# Patient Record
Sex: Female | Born: 1945 | Race: Black or African American | Hispanic: No | Marital: Married | Smoking: Former smoker
Health system: Southern US, Community
[De-identification: ages and names within clinical notes are randomized; demographics above are authoritative.]

## PROBLEM LIST (undated history)

## (undated) DIAGNOSIS — E785 Hyperlipidemia, unspecified: Secondary | ICD-10-CM

## (undated) DIAGNOSIS — I714 Abdominal aortic aneurysm, without rupture, unspecified: Secondary | ICD-10-CM

## (undated) DIAGNOSIS — I10 Essential (primary) hypertension: Secondary | ICD-10-CM

## (undated) HISTORY — PX: BREAST BIOPSY: SHX20

## (undated) HISTORY — DX: Abdominal aortic aneurysm, without rupture, unspecified: I71.40

## (undated) HISTORY — DX: Hyperlipidemia, unspecified: E78.5

---

## 1971-02-20 HISTORY — PX: CEREBRAL ANEURYSM REPAIR: SHX164

## 2016-04-18 DIAGNOSIS — Z1231 Encounter for screening mammogram for malignant neoplasm of breast: Secondary | ICD-10-CM | POA: Diagnosis not present

## 2016-04-18 DIAGNOSIS — I10 Essential (primary) hypertension: Secondary | ICD-10-CM | POA: Diagnosis not present

## 2016-04-24 ENCOUNTER — Other Ambulatory Visit: Payer: Self-pay | Admitting: Family

## 2016-04-24 DIAGNOSIS — Z1231 Encounter for screening mammogram for malignant neoplasm of breast: Secondary | ICD-10-CM

## 2016-05-08 ENCOUNTER — Ambulatory Visit
Admission: RE | Admit: 2016-05-08 | Discharge: 2016-05-08 | Disposition: A | Discharge status: Home / Self Care | Payer: Medicare HMO | Source: Ambulatory Visit | Attending: Family | Admitting: Family

## 2016-05-08 DIAGNOSIS — Z1231 Encounter for screening mammogram for malignant neoplasm of breast: Secondary | ICD-10-CM | POA: Diagnosis not present

## 2016-07-23 DIAGNOSIS — S80861A Insect bite (nonvenomous), right lower leg, initial encounter: Secondary | ICD-10-CM | POA: Diagnosis not present

## 2016-07-23 DIAGNOSIS — R21 Rash and other nonspecific skin eruption: Secondary | ICD-10-CM | POA: Diagnosis not present

## 2016-07-23 DIAGNOSIS — W57XXXA Bitten or stung by nonvenomous insect and other nonvenomous arthropods, initial encounter: Secondary | ICD-10-CM | POA: Diagnosis not present

## 2016-08-14 DIAGNOSIS — R69 Illness, unspecified: Secondary | ICD-10-CM | POA: Diagnosis not present

## 2016-10-17 DIAGNOSIS — I10 Essential (primary) hypertension: Secondary | ICD-10-CM | POA: Diagnosis not present

## 2016-10-17 DIAGNOSIS — Z720 Tobacco use: Secondary | ICD-10-CM | POA: Diagnosis not present

## 2016-10-17 DIAGNOSIS — D649 Anemia, unspecified: Secondary | ICD-10-CM | POA: Diagnosis not present

## 2016-10-17 DIAGNOSIS — M810 Age-related osteoporosis without current pathological fracture: Secondary | ICD-10-CM | POA: Diagnosis not present

## 2016-10-17 DIAGNOSIS — E782 Mixed hyperlipidemia: Secondary | ICD-10-CM | POA: Diagnosis not present

## 2017-12-16 ENCOUNTER — Other Ambulatory Visit: Payer: Self-pay | Admitting: Nurse Practitioner

## 2017-12-16 DIAGNOSIS — Z0000 Encounter for general adult medical examination without abnormal findings: Secondary | ICD-10-CM | POA: Diagnosis not present

## 2017-12-16 DIAGNOSIS — D649 Anemia, unspecified: Secondary | ICD-10-CM | POA: Diagnosis not present

## 2017-12-16 DIAGNOSIS — Z720 Tobacco use: Secondary | ICD-10-CM | POA: Diagnosis not present

## 2017-12-16 DIAGNOSIS — Z1239 Encounter for other screening for malignant neoplasm of breast: Secondary | ICD-10-CM | POA: Diagnosis not present

## 2017-12-16 DIAGNOSIS — M810 Age-related osteoporosis without current pathological fracture: Secondary | ICD-10-CM | POA: Diagnosis not present

## 2017-12-16 DIAGNOSIS — R69 Illness, unspecified: Secondary | ICD-10-CM | POA: Diagnosis not present

## 2017-12-16 DIAGNOSIS — I10 Essential (primary) hypertension: Secondary | ICD-10-CM | POA: Diagnosis not present

## 2017-12-16 DIAGNOSIS — Z1211 Encounter for screening for malignant neoplasm of colon: Secondary | ICD-10-CM | POA: Diagnosis not present

## 2017-12-16 DIAGNOSIS — Z1231 Encounter for screening mammogram for malignant neoplasm of breast: Secondary | ICD-10-CM

## 2017-12-16 DIAGNOSIS — E782 Mixed hyperlipidemia: Secondary | ICD-10-CM | POA: Diagnosis not present

## 2017-12-18 ENCOUNTER — Other Ambulatory Visit: Payer: Self-pay | Admitting: Nurse Practitioner

## 2017-12-18 DIAGNOSIS — M810 Age-related osteoporosis without current pathological fracture: Secondary | ICD-10-CM

## 2018-01-03 ENCOUNTER — Other Ambulatory Visit: Payer: Self-pay | Admitting: Nurse Practitioner

## 2018-01-03 DIAGNOSIS — M8580 Other specified disorders of bone density and structure, unspecified site: Secondary | ICD-10-CM

## 2018-01-14 ENCOUNTER — Ambulatory Visit
Admission: RE | Admit: 2018-01-14 | Discharge: 2018-01-14 | Disposition: A | Discharge status: Home / Self Care | Payer: Medicare HMO | Source: Ambulatory Visit | Attending: Nurse Practitioner | Admitting: Nurse Practitioner

## 2018-01-14 DIAGNOSIS — Z780 Asymptomatic menopausal state: Secondary | ICD-10-CM | POA: Diagnosis not present

## 2018-01-14 DIAGNOSIS — Z1231 Encounter for screening mammogram for malignant neoplasm of breast: Secondary | ICD-10-CM

## 2018-01-14 DIAGNOSIS — M8580 Other specified disorders of bone density and structure, unspecified site: Secondary | ICD-10-CM

## 2018-01-14 DIAGNOSIS — M8589 Other specified disorders of bone density and structure, multiple sites: Secondary | ICD-10-CM | POA: Diagnosis not present

## 2018-07-16 DIAGNOSIS — Z720 Tobacco use: Secondary | ICD-10-CM | POA: Diagnosis not present

## 2018-07-16 DIAGNOSIS — I10 Essential (primary) hypertension: Secondary | ICD-10-CM | POA: Diagnosis not present

## 2018-07-16 DIAGNOSIS — M810 Age-related osteoporosis without current pathological fracture: Secondary | ICD-10-CM | POA: Diagnosis not present

## 2018-07-16 DIAGNOSIS — E785 Hyperlipidemia, unspecified: Secondary | ICD-10-CM | POA: Diagnosis not present

## 2019-01-07 ENCOUNTER — Other Ambulatory Visit: Payer: Self-pay | Admitting: Internal Medicine

## 2019-01-07 DIAGNOSIS — R3989 Other symptoms and signs involving the genitourinary system: Secondary | ICD-10-CM | POA: Diagnosis not present

## 2019-01-07 DIAGNOSIS — Z1231 Encounter for screening mammogram for malignant neoplasm of breast: Secondary | ICD-10-CM | POA: Diagnosis not present

## 2019-01-07 DIAGNOSIS — I10 Essential (primary) hypertension: Secondary | ICD-10-CM | POA: Diagnosis not present

## 2019-01-07 DIAGNOSIS — E785 Hyperlipidemia, unspecified: Secondary | ICD-10-CM | POA: Diagnosis not present

## 2019-01-20 ENCOUNTER — Other Ambulatory Visit: Payer: Self-pay

## 2019-01-20 ENCOUNTER — Ambulatory Visit
Admission: RE | Admit: 2019-01-20 | Discharge: 2019-01-20 | Disposition: A | Discharge status: Home / Self Care | Payer: Medicare HMO | Source: Ambulatory Visit | Attending: Internal Medicine | Admitting: Internal Medicine

## 2019-01-20 DIAGNOSIS — Z1231 Encounter for screening mammogram for malignant neoplasm of breast: Secondary | ICD-10-CM

## 2019-01-21 ENCOUNTER — Other Ambulatory Visit: Payer: Self-pay | Admitting: Internal Medicine

## 2019-01-21 DIAGNOSIS — R928 Other abnormal and inconclusive findings on diagnostic imaging of breast: Secondary | ICD-10-CM

## 2019-01-27 ENCOUNTER — Ambulatory Visit
Admission: RE | Admit: 2019-01-27 | Discharge: 2019-01-27 | Disposition: A | Discharge status: Home / Self Care | Payer: Medicare HMO | Source: Ambulatory Visit | Attending: Internal Medicine | Admitting: Internal Medicine

## 2019-01-27 ENCOUNTER — Other Ambulatory Visit: Payer: Self-pay | Admitting: Internal Medicine

## 2019-01-27 ENCOUNTER — Other Ambulatory Visit: Payer: Self-pay

## 2019-01-27 DIAGNOSIS — R928 Other abnormal and inconclusive findings on diagnostic imaging of breast: Secondary | ICD-10-CM

## 2019-01-27 DIAGNOSIS — N6320 Unspecified lump in the left breast, unspecified quadrant: Secondary | ICD-10-CM

## 2019-01-29 ENCOUNTER — Other Ambulatory Visit: Payer: Medicare HMO

## 2019-02-04 DIAGNOSIS — Z1389 Encounter for screening for other disorder: Secondary | ICD-10-CM | POA: Diagnosis not present

## 2019-02-04 DIAGNOSIS — Z0000 Encounter for general adult medical examination without abnormal findings: Secondary | ICD-10-CM | POA: Diagnosis not present

## 2019-02-16 ENCOUNTER — Other Ambulatory Visit: Payer: Self-pay | Admitting: Internal Medicine

## 2019-02-16 ENCOUNTER — Ambulatory Visit
Admission: RE | Admit: 2019-02-16 | Discharge: 2019-02-16 | Disposition: A | Discharge status: Home / Self Care | Payer: Medicare HMO | Source: Ambulatory Visit | Attending: Internal Medicine | Admitting: Internal Medicine

## 2019-02-16 ENCOUNTER — Other Ambulatory Visit: Payer: Self-pay

## 2019-02-16 DIAGNOSIS — N6320 Unspecified lump in the left breast, unspecified quadrant: Secondary | ICD-10-CM

## 2019-05-25 DIAGNOSIS — R69 Illness, unspecified: Secondary | ICD-10-CM | POA: Diagnosis not present

## 2019-07-21 DIAGNOSIS — H5203 Hypermetropia, bilateral: Secondary | ICD-10-CM | POA: Diagnosis not present

## 2019-07-21 DIAGNOSIS — H25813 Combined forms of age-related cataract, bilateral: Secondary | ICD-10-CM | POA: Diagnosis not present

## 2019-07-21 DIAGNOSIS — H52223 Regular astigmatism, bilateral: Secondary | ICD-10-CM | POA: Diagnosis not present

## 2019-07-21 DIAGNOSIS — H524 Presbyopia: Secondary | ICD-10-CM | POA: Diagnosis not present

## 2019-07-21 DIAGNOSIS — H40013 Open angle with borderline findings, low risk, bilateral: Secondary | ICD-10-CM | POA: Diagnosis not present

## 2019-08-19 ENCOUNTER — Other Ambulatory Visit: Payer: Medicare HMO

## 2019-12-29 DIAGNOSIS — E785 Hyperlipidemia, unspecified: Secondary | ICD-10-CM | POA: Diagnosis not present

## 2019-12-29 DIAGNOSIS — I10 Essential (primary) hypertension: Secondary | ICD-10-CM | POA: Diagnosis not present

## 2020-02-02 DIAGNOSIS — R69 Illness, unspecified: Secondary | ICD-10-CM | POA: Diagnosis not present

## 2020-02-26 DIAGNOSIS — Z1211 Encounter for screening for malignant neoplasm of colon: Secondary | ICD-10-CM | POA: Diagnosis not present

## 2020-02-26 DIAGNOSIS — E785 Hyperlipidemia, unspecified: Secondary | ICD-10-CM | POA: Diagnosis not present

## 2020-02-26 DIAGNOSIS — Z7189 Other specified counseling: Secondary | ICD-10-CM | POA: Diagnosis not present

## 2020-02-26 DIAGNOSIS — Z0000 Encounter for general adult medical examination without abnormal findings: Secondary | ICD-10-CM | POA: Diagnosis not present

## 2020-02-26 DIAGNOSIS — Z720 Tobacco use: Secondary | ICD-10-CM | POA: Diagnosis not present

## 2020-02-26 DIAGNOSIS — M85859 Other specified disorders of bone density and structure, unspecified thigh: Secondary | ICD-10-CM | POA: Diagnosis not present

## 2020-02-26 DIAGNOSIS — Z1389 Encounter for screening for other disorder: Secondary | ICD-10-CM | POA: Diagnosis not present

## 2020-02-26 DIAGNOSIS — I10 Essential (primary) hypertension: Secondary | ICD-10-CM | POA: Diagnosis not present

## 2020-02-26 DIAGNOSIS — Z1231 Encounter for screening mammogram for malignant neoplasm of breast: Secondary | ICD-10-CM | POA: Diagnosis not present

## 2020-03-05 ENCOUNTER — Other Ambulatory Visit: Payer: Self-pay | Admitting: Internal Medicine

## 2020-03-05 DIAGNOSIS — M85859 Other specified disorders of bone density and structure, unspecified thigh: Secondary | ICD-10-CM

## 2020-06-29 ENCOUNTER — Other Ambulatory Visit: Payer: Medicare HMO

## 2020-08-02 IMAGING — MG DIGITAL SCREENING BILAT W/ TOMO W/ CAD
8 series · 9 of 24 positions shown · non-contrast
Comparison: Previous exam(s).

CLINICAL DATA: Screening.

EXAM:
DIGITAL SCREENING BILATERAL MAMMOGRAM WITH TOMO AND CAD

[R MLO synth-2D]
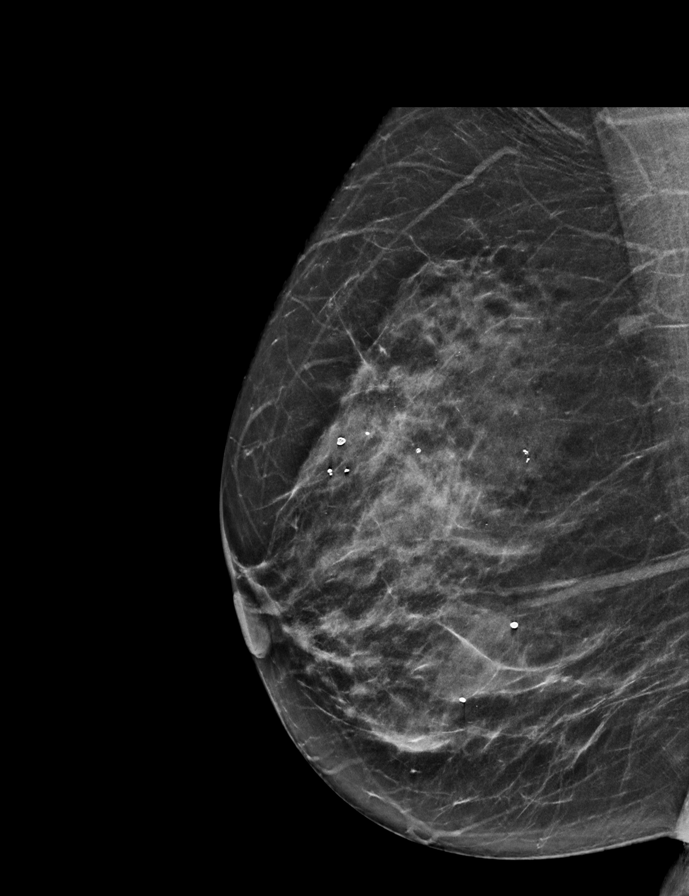

[R CC synth-2D]
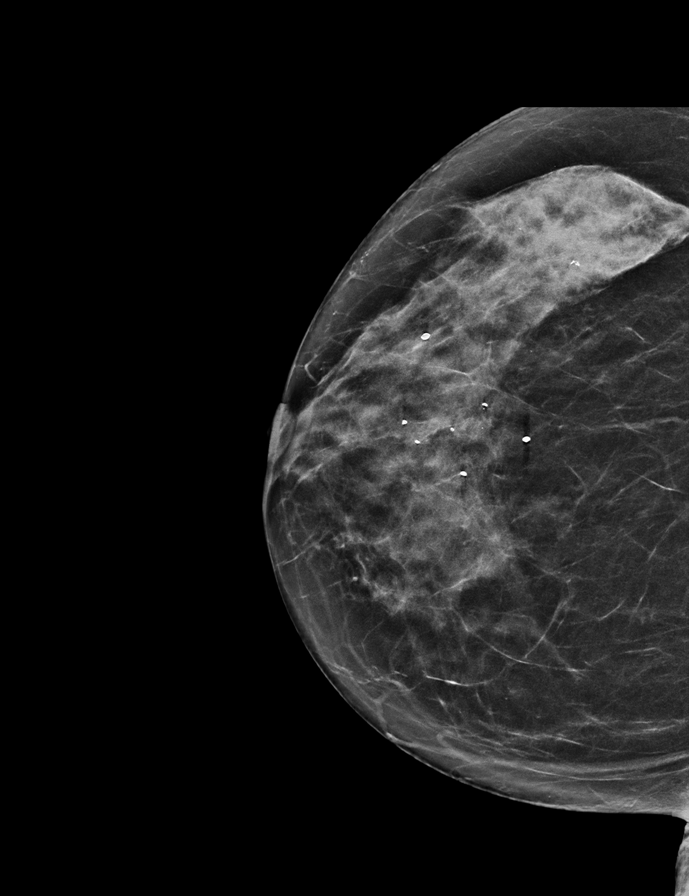

[L MLO synth-2D]
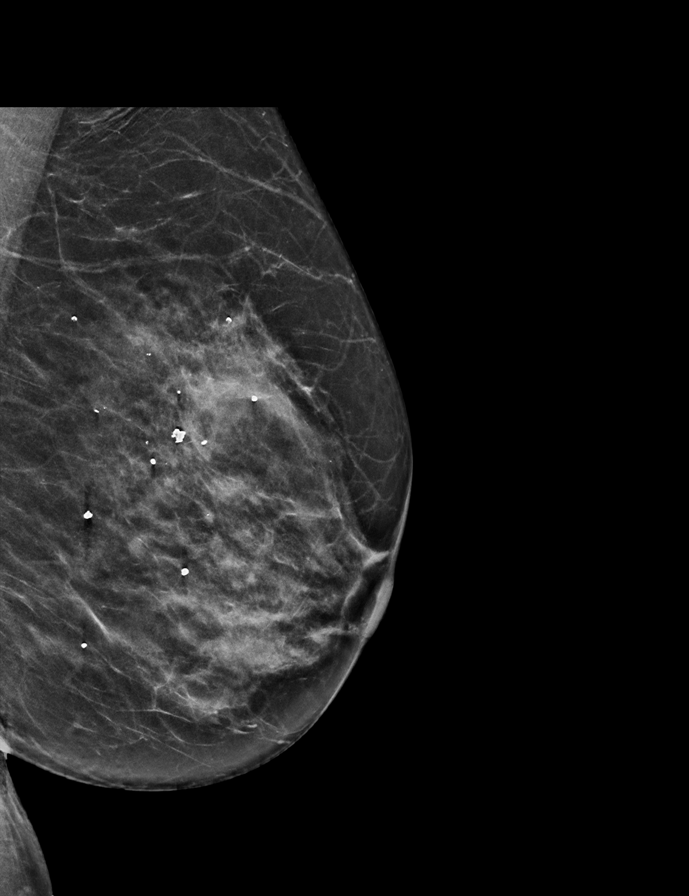

[L CC synth-2D]
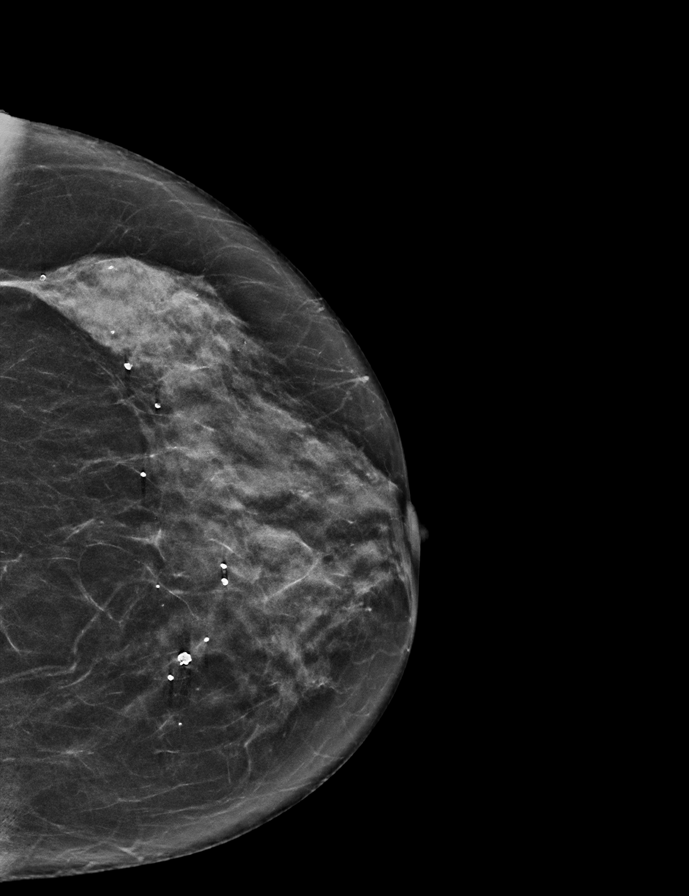

[L CC tomo · 2 of 56 frames shown]
[frame 19/56]
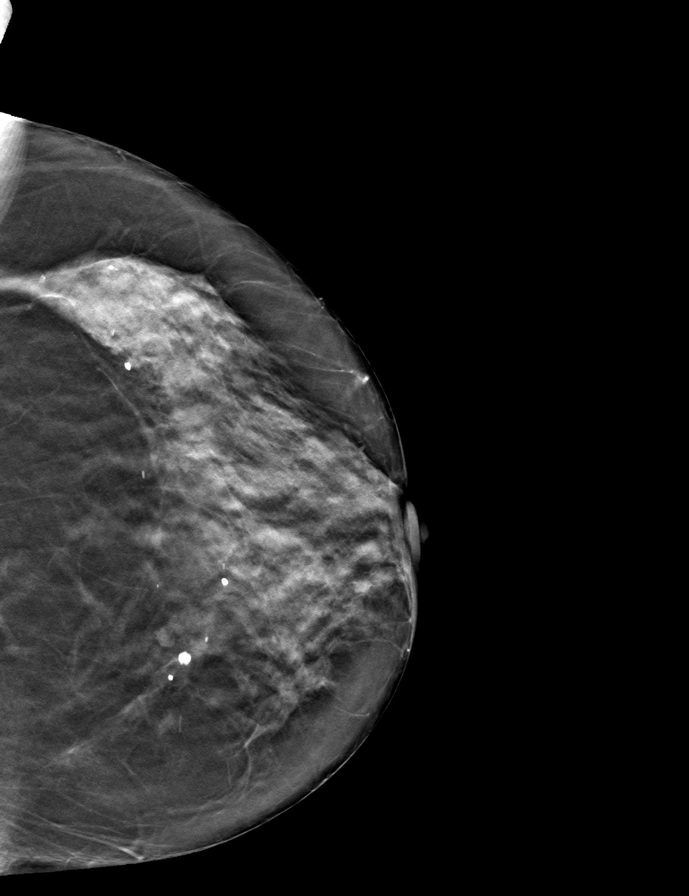
[frame 29/56]
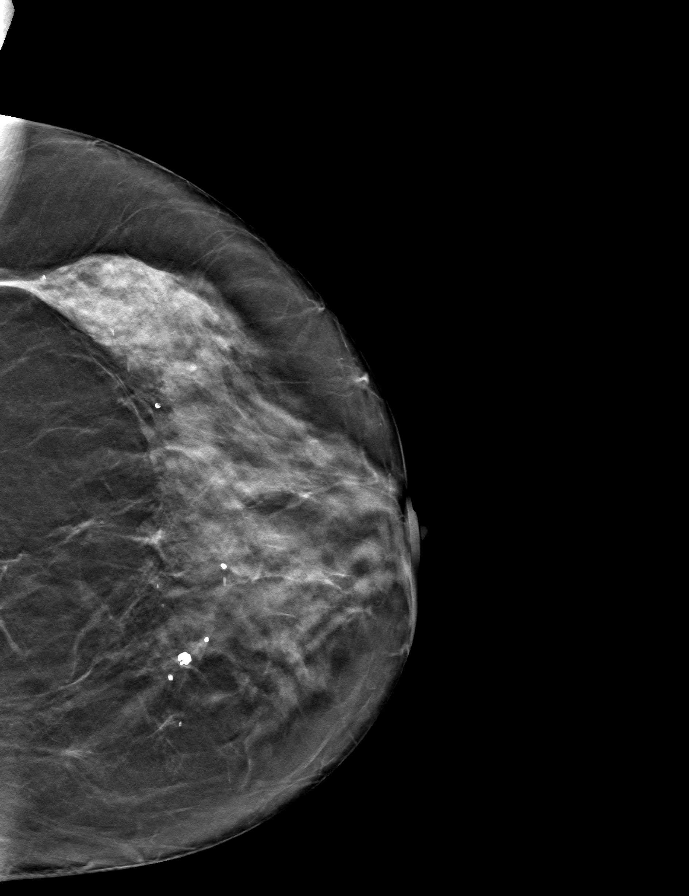

[L MLO tomo · tomo slice 29/57.0]
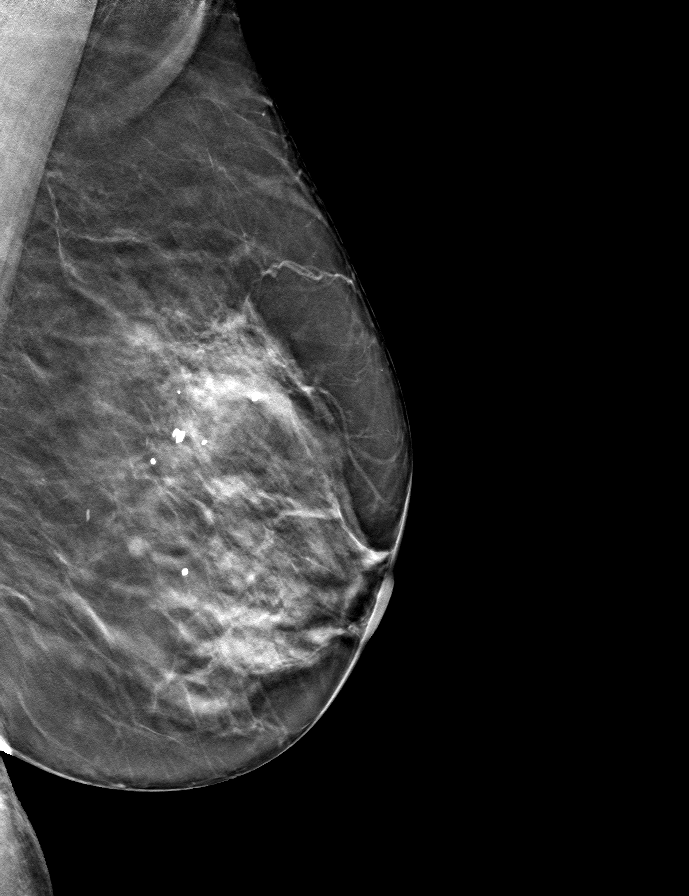

[R MLO tomo · tomo slice 29/57.0]
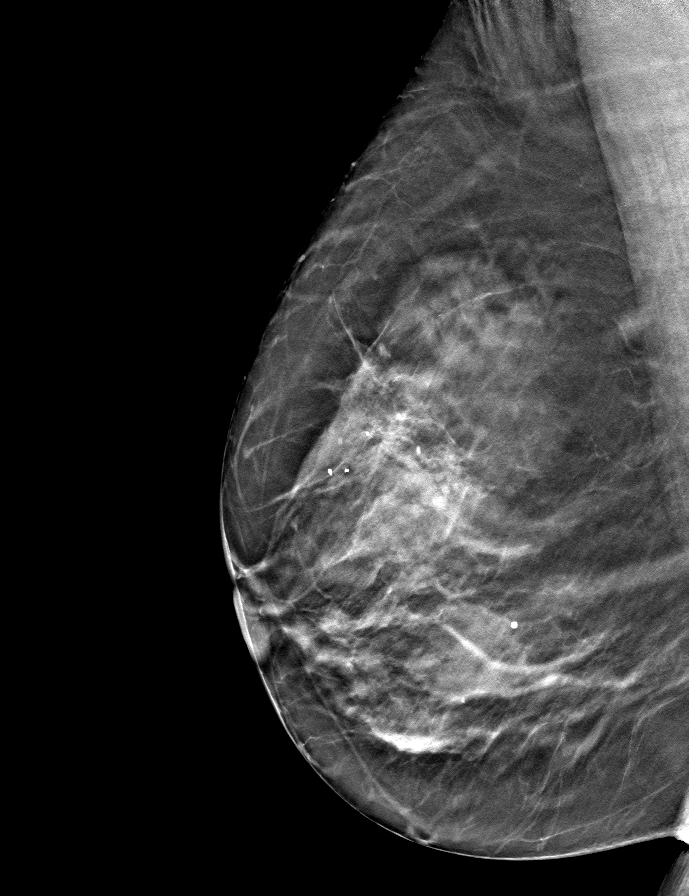

[R CC tomo · tomo slice 29/58.0]
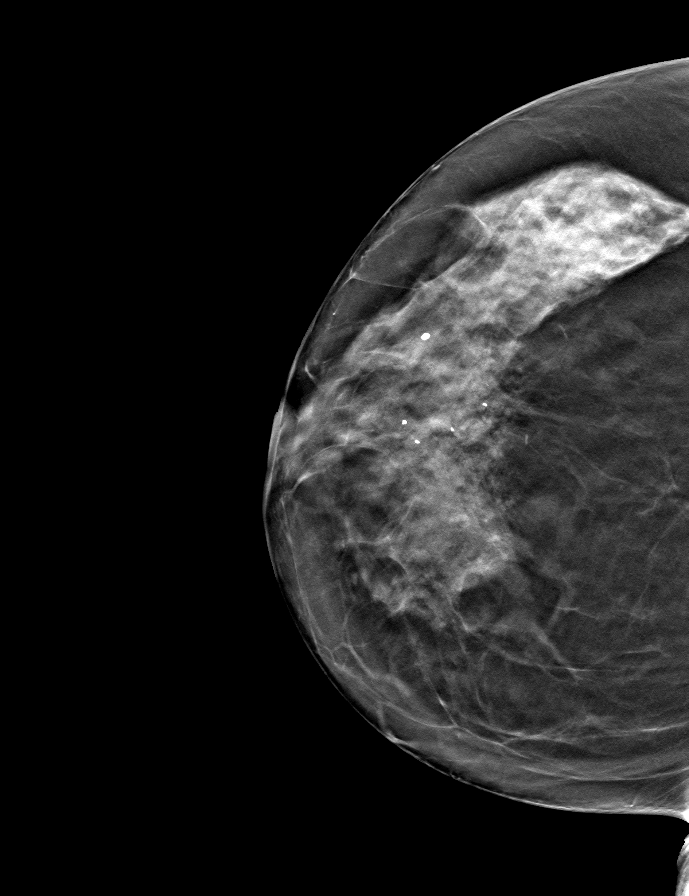

[9 of 24 positions shown; findings below may reference images not displayed]

ACR Breast Density Category c: The breast tissue is heterogeneously
dense, which may obscure small masses.
FINDINGS: In the left breast, a possible mass warrants further evaluation. In
the right breast, no findings suspicious for malignancy.

Images were processed with CAD.
IMPRESSION: Further evaluation is suggested for possible mass in the left
breast.

RECOMMENDATION:
Diagnostic mammogram and possibly ultrasound of the left breast.
(Code:IJ-[id number])

The patient will be contacted regarding the findings, and additional
imaging will be scheduled.

BI-RADS CATEGORY  0: Incomplete. Need additional imaging evaluation
and/or prior mammograms for comparison.

## 2020-08-09 IMAGING — US US BREAST*L* LIMITED INC AXILLA
1 series · 5 of 5 positions shown · non-contrast
Comparison: Previous exam(s).
COMPARISON: Previous exam(s).

Addendum:
CLINICAL DATA: Patient presents today recall from screening for a
possible left breast mass.

EXAM:
DIGITAL DIAGNOSTIC LEFT MAMMOGRAM WITH TOMO
ULTRASOUND LEFT BREAST

[Series 1: us breast*left* limited inc axilla · 0.06mm/px · 5 of 5 slices shown]
[im 1/5]
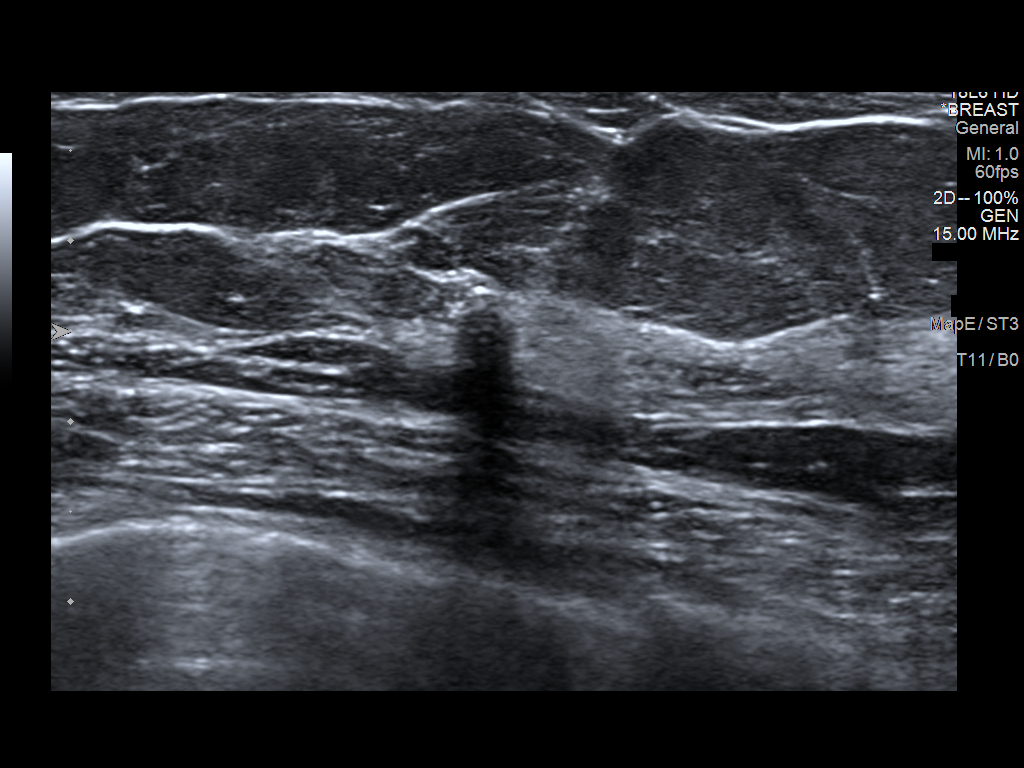
[im 2/5]
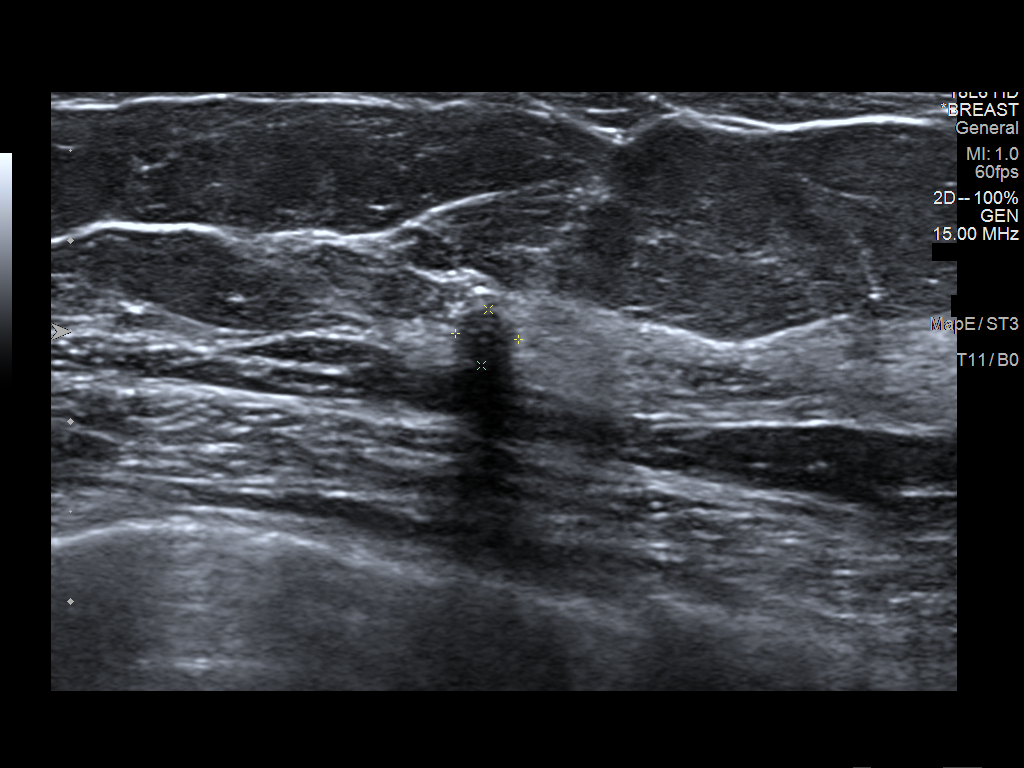
[im 3/5]
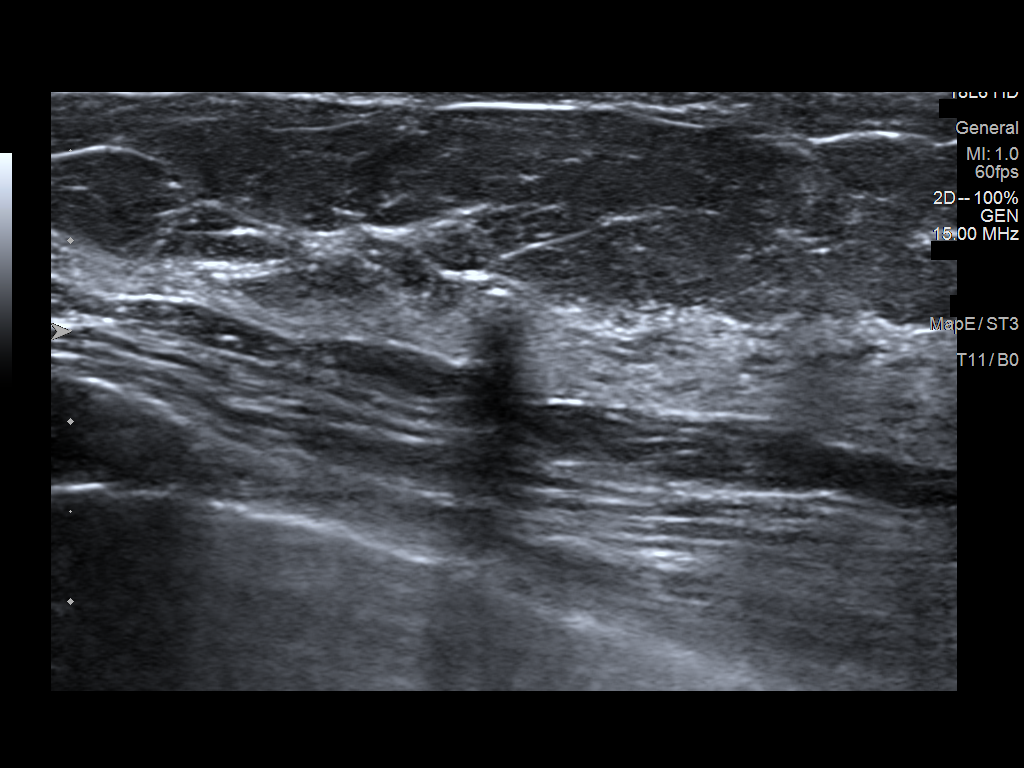
[im 4/5]
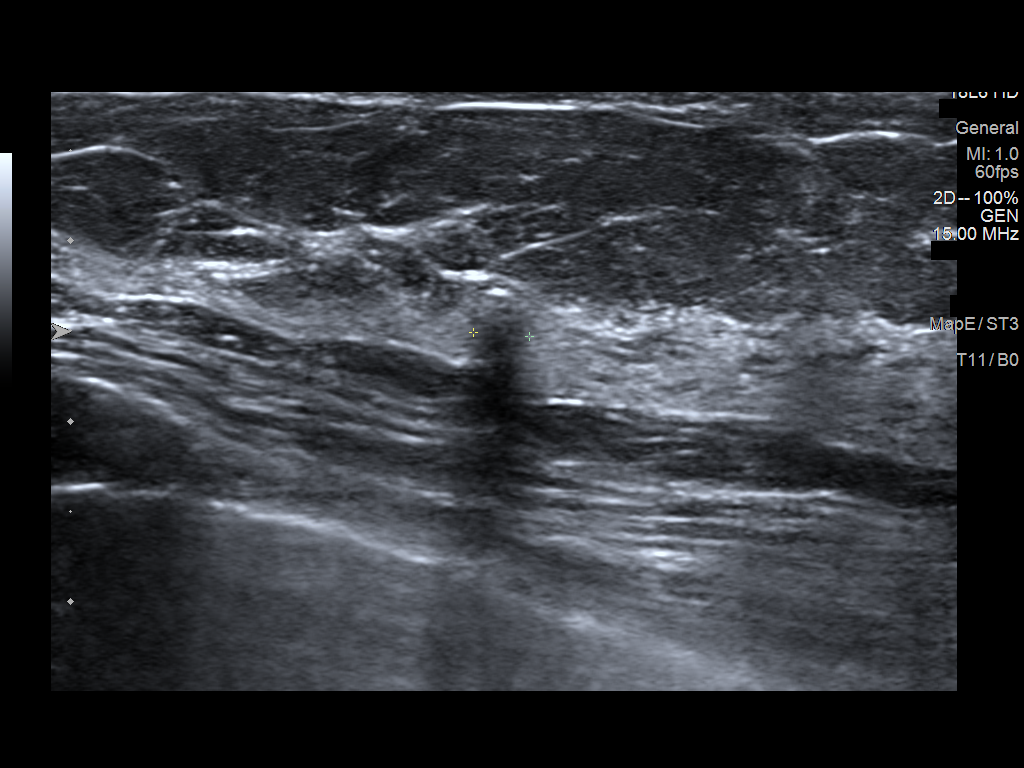
[im 5/5]
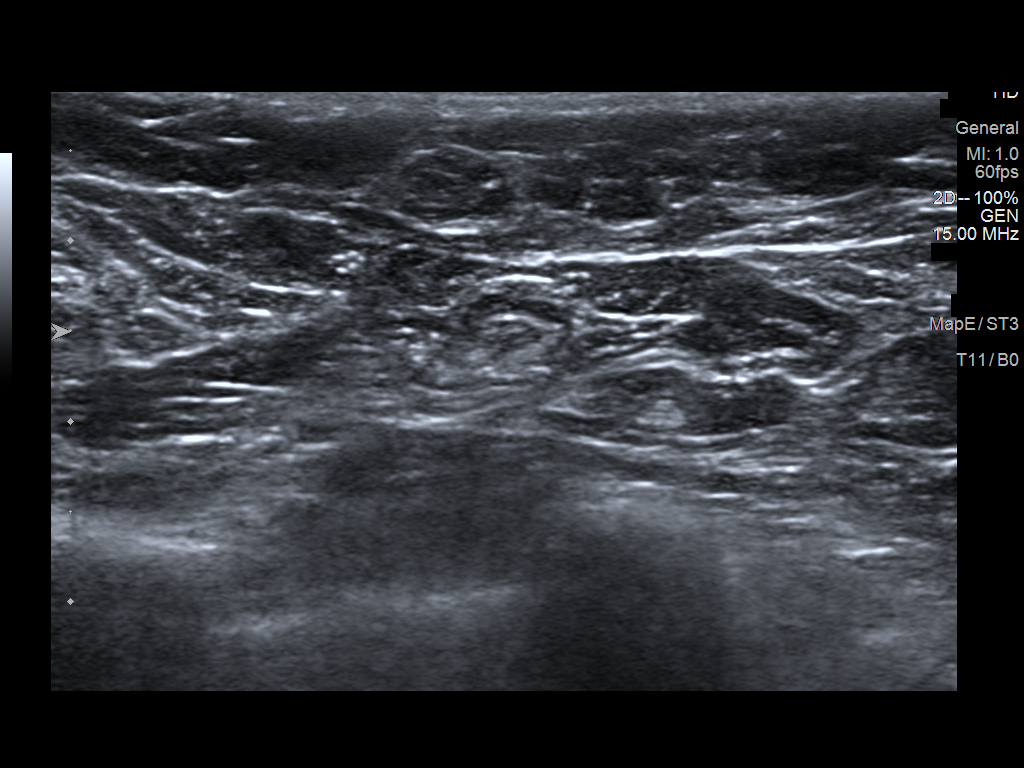

[5 of 5 positions shown; findings below may reference images not displayed]

ACR Breast Density Category c: The breast tissue is heterogeneously
dense, which may obscure small masses.
FINDINGS: Mammogram: Additional spot compression tomosynthesis views were
performed for the questioned mass in the left breast. On the
additional imaging there is persistence of an irregular 0.4 cm mass
in the central medial left breast.

Ultrasound:

Targeted ultrasound is performed at 10 o'clock 4 cm from the nipple
demonstrating an oval hypoechoic mass with indistinct margins
measuring 0.4 x 0.3 x 0.3 cm, which likely corresponds to the
mammographic finding. Targeted ultrasound of the left axilla
demonstrates no suspicious appearing lymph nodes.
IMPRESSION: Left breast mass at 10 o'clock measuring 0.4 cm is suspicious.
Tissue sampling is recommended.

RECOMMENDATION:
Ultrasound-guided core needle biopsy of the left breast mass at 10
o'clock. This will be scheduled at the patient's convenience.

I have discussed the findings and recommendations with the patient.

BI-RADS CATEGORY  4: Suspicious.

ADDENDUM:
Patient presented today for ultrasound-guided core biopsy of an
indeterminate mass in the LEFT breast.

Procedure was explained to the patient, including the requirement of
post procedure biopsy clip placement in case surgical excision was
eventually required based on subsequent pathology results. Patient
declined the procedure because of the need for biopsy clip
placement. Patient stated that she would be more comfortable with a
six-month follow-up diagnostic mammogram which was scheduled for the
patient. Patient understands that the LEFT breast lesion could be
neoplastic and patient was counseled to proceed with a biopsy to
exclude malignancy. Patient also understands that she can call us
and reschedule the biopsy if she changes her mind.

*** End of Addendum ***
ACR Breast Density Category c: The breast tissue is heterogeneously
dense, which may obscure small masses.
FINDINGS: Mammogram: Additional spot compression tomosynthesis views were
performed for the questioned mass in the left breast. On the
additional imaging there is persistence of an irregular 0.4 cm mass
in the central medial left breast.

Ultrasound:

Targeted ultrasound is performed at 10 o'clock 4 cm from the nipple
demonstrating an oval hypoechoic mass with indistinct margins
measuring 0.4 x 0.3 x 0.3 cm, which likely corresponds to the
mammographic finding. Targeted ultrasound of the left axilla
demonstrates no suspicious appearing lymph nodes.
IMPRESSION: Left breast mass at 10 o'clock measuring 0.4 cm is suspicious.
Tissue sampling is recommended.

RECOMMENDATION:
Ultrasound-guided core needle biopsy of the left breast mass at 10
o'clock. This will be scheduled at the patient's convenience.

I have discussed the findings and recommendations with the patient.

BI-RADS CATEGORY  4: Suspicious.

## 2020-12-05 ENCOUNTER — Ambulatory Visit
Admission: RE | Admit: 2020-12-05 | Discharge: 2020-12-05 | Disposition: A | Discharge status: Home / Self Care | Payer: Medicare HMO | Source: Ambulatory Visit | Attending: Internal Medicine | Admitting: Internal Medicine

## 2020-12-05 ENCOUNTER — Other Ambulatory Visit: Payer: Self-pay

## 2020-12-05 DIAGNOSIS — M8589 Other specified disorders of bone density and structure, multiple sites: Secondary | ICD-10-CM | POA: Diagnosis not present

## 2020-12-05 DIAGNOSIS — Z780 Asymptomatic menopausal state: Secondary | ICD-10-CM | POA: Diagnosis not present

## 2020-12-05 DIAGNOSIS — M85859 Other specified disorders of bone density and structure, unspecified thigh: Secondary | ICD-10-CM

## 2021-02-02 DIAGNOSIS — H25813 Combined forms of age-related cataract, bilateral: Secondary | ICD-10-CM | POA: Diagnosis not present

## 2021-02-02 DIAGNOSIS — H40013 Open angle with borderline findings, low risk, bilateral: Secondary | ICD-10-CM | POA: Diagnosis not present

## 2021-03-01 DIAGNOSIS — Z23 Encounter for immunization: Secondary | ICD-10-CM | POA: Diagnosis not present

## 2021-03-01 DIAGNOSIS — E785 Hyperlipidemia, unspecified: Secondary | ICD-10-CM | POA: Diagnosis not present

## 2021-03-01 DIAGNOSIS — Z1389 Encounter for screening for other disorder: Secondary | ICD-10-CM | POA: Diagnosis not present

## 2021-03-01 DIAGNOSIS — Z720 Tobacco use: Secondary | ICD-10-CM | POA: Diagnosis not present

## 2021-03-01 DIAGNOSIS — Z1231 Encounter for screening mammogram for malignant neoplasm of breast: Secondary | ICD-10-CM | POA: Diagnosis not present

## 2021-03-01 DIAGNOSIS — M85859 Other specified disorders of bone density and structure, unspecified thigh: Secondary | ICD-10-CM | POA: Diagnosis not present

## 2021-03-01 DIAGNOSIS — Z0000 Encounter for general adult medical examination without abnormal findings: Secondary | ICD-10-CM | POA: Diagnosis not present

## 2021-03-01 DIAGNOSIS — I10 Essential (primary) hypertension: Secondary | ICD-10-CM | POA: Diagnosis not present

## 2021-03-01 DIAGNOSIS — R928 Other abnormal and inconclusive findings on diagnostic imaging of breast: Secondary | ICD-10-CM | POA: Diagnosis not present

## 2021-05-02 ENCOUNTER — Other Ambulatory Visit: Payer: Self-pay | Admitting: Internal Medicine

## 2021-05-02 DIAGNOSIS — R928 Other abnormal and inconclusive findings on diagnostic imaging of breast: Secondary | ICD-10-CM

## 2021-05-15 ENCOUNTER — Other Ambulatory Visit: Payer: Self-pay | Admitting: Internal Medicine

## 2021-05-16 ENCOUNTER — Other Ambulatory Visit: Payer: Self-pay | Admitting: Internal Medicine

## 2021-05-16 DIAGNOSIS — N6320 Unspecified lump in the left breast, unspecified quadrant: Secondary | ICD-10-CM

## 2021-05-17 ENCOUNTER — Other Ambulatory Visit: Payer: Self-pay | Admitting: Internal Medicine

## 2021-05-17 ENCOUNTER — Ambulatory Visit
Admission: RE | Admit: 2021-05-17 | Discharge: 2021-05-17 | Disposition: A | Discharge status: Home / Self Care | Payer: Medicare HMO | Source: Ambulatory Visit | Attending: Internal Medicine | Admitting: Internal Medicine

## 2021-05-17 ENCOUNTER — Other Ambulatory Visit: Payer: Self-pay

## 2021-05-17 DIAGNOSIS — N6320 Unspecified lump in the left breast, unspecified quadrant: Secondary | ICD-10-CM

## 2021-05-17 DIAGNOSIS — R922 Inconclusive mammogram: Secondary | ICD-10-CM | POA: Diagnosis not present

## 2021-11-28 DIAGNOSIS — E785 Hyperlipidemia, unspecified: Secondary | ICD-10-CM | POA: Diagnosis not present

## 2021-11-28 DIAGNOSIS — M85859 Other specified disorders of bone density and structure, unspecified thigh: Secondary | ICD-10-CM | POA: Diagnosis not present

## 2021-11-28 DIAGNOSIS — Z720 Tobacco use: Secondary | ICD-10-CM | POA: Diagnosis not present

## 2021-11-28 DIAGNOSIS — I10 Essential (primary) hypertension: Secondary | ICD-10-CM | POA: Diagnosis not present

## 2022-01-19 ENCOUNTER — Emergency Department (HOSPITAL_COMMUNITY): Payer: Medicare HMO

## 2022-01-19 ENCOUNTER — Emergency Department (HOSPITAL_COMMUNITY)
Admission: EM | Admit: 2022-01-19 | Discharge: 2022-01-19 | Disposition: A | Discharge status: Home / Self Care | Payer: Medicare HMO | Attending: Emergency Medicine | Admitting: Emergency Medicine

## 2022-01-19 ENCOUNTER — Other Ambulatory Visit: Payer: Self-pay

## 2022-01-19 ENCOUNTER — Encounter (HOSPITAL_COMMUNITY): Payer: Self-pay

## 2022-01-19 DIAGNOSIS — S8002XA Contusion of left knee, initial encounter: Secondary | ICD-10-CM | POA: Diagnosis not present

## 2022-01-19 DIAGNOSIS — S0993XA Unspecified injury of face, initial encounter: Secondary | ICD-10-CM | POA: Diagnosis not present

## 2022-01-19 DIAGNOSIS — R69 Illness, unspecified: Secondary | ICD-10-CM | POA: Diagnosis not present

## 2022-01-19 DIAGNOSIS — S0083XA Contusion of other part of head, initial encounter: Secondary | ICD-10-CM

## 2022-01-19 DIAGNOSIS — R930 Abnormal findings on diagnostic imaging of skull and head, not elsewhere classified: Secondary | ICD-10-CM | POA: Insufficient documentation

## 2022-01-19 DIAGNOSIS — I10 Essential (primary) hypertension: Secondary | ICD-10-CM | POA: Insufficient documentation

## 2022-01-19 DIAGNOSIS — M25562 Pain in left knee: Secondary | ICD-10-CM | POA: Diagnosis not present

## 2022-01-19 HISTORY — DX: Essential (primary) hypertension: I10
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
## 2022-01-19 NOTE — Discharge Instructions (Addendum)
Follow up with neurosurgery, please call today to schedule an appointment.  Follow up with your primary care provider for recheck. Return to the ER for new or worsening symptoms.

## 2022-01-23 DIAGNOSIS — G930 Cerebral cysts: Secondary | ICD-10-CM | POA: Diagnosis not present

## 2022-01-23 DIAGNOSIS — E785 Hyperlipidemia, unspecified: Secondary | ICD-10-CM | POA: Diagnosis not present

## 2022-01-23 DIAGNOSIS — I10 Essential (primary) hypertension: Secondary | ICD-10-CM | POA: Diagnosis not present

## 2022-01-23 DIAGNOSIS — Z8679 Personal history of other diseases of the circulatory system: Secondary | ICD-10-CM | POA: Diagnosis not present

## 2022-01-31 DIAGNOSIS — G930 Cerebral cysts: Secondary | ICD-10-CM | POA: Diagnosis not present

## 2022-02-08 DIAGNOSIS — H52223 Regular astigmatism, bilateral: Secondary | ICD-10-CM | POA: Diagnosis not present

## 2022-02-08 DIAGNOSIS — H40013 Open angle with borderline findings, low risk, bilateral: Secondary | ICD-10-CM | POA: Diagnosis not present

## 2022-02-08 DIAGNOSIS — H5203 Hypermetropia, bilateral: Secondary | ICD-10-CM | POA: Diagnosis not present

## 2022-02-08 DIAGNOSIS — H524 Presbyopia: Secondary | ICD-10-CM | POA: Diagnosis not present

## 2022-02-08 DIAGNOSIS — H25813 Combined forms of age-related cataract, bilateral: Secondary | ICD-10-CM | POA: Diagnosis not present

## 2022-04-09 ENCOUNTER — Other Ambulatory Visit (HOSPITAL_COMMUNITY): Payer: Self-pay | Admitting: Neurosurgery

## 2022-04-09 DIAGNOSIS — G930 Cerebral cysts: Secondary | ICD-10-CM

## 2022-04-30 ENCOUNTER — Ambulatory Visit (HOSPITAL_COMMUNITY)
Admission: RE | Admit: 2022-04-30 | Discharge: 2022-04-30 | Disposition: A | Discharge status: Home / Self Care | Payer: Medicare Other | Source: Ambulatory Visit | Attending: Neurosurgery | Admitting: Neurosurgery

## 2022-04-30 DIAGNOSIS — G930 Cerebral cysts: Secondary | ICD-10-CM

## 2023-03-18 ENCOUNTER — Other Ambulatory Visit: Payer: Self-pay | Admitting: Neurosurgery

## 2023-03-18 DIAGNOSIS — G930 Cerebral cysts: Secondary | ICD-10-CM

## 2023-04-11 ENCOUNTER — Other Ambulatory Visit: Payer: Medicare Other

## 2023-05-08 ENCOUNTER — Ambulatory Visit
Admission: RE | Admit: 2023-05-08 | Discharge: 2023-05-08 | Disposition: A | Discharge status: Home / Self Care | Payer: Medicare Other | Source: Ambulatory Visit | Attending: Neurosurgery | Admitting: Neurosurgery

## 2023-05-08 DIAGNOSIS — G930 Cerebral cysts: Secondary | ICD-10-CM

## 2023-09-18 ENCOUNTER — Other Ambulatory Visit: Payer: Self-pay | Admitting: *Deleted

## 2023-09-18 DIAGNOSIS — I714 Abdominal aortic aneurysm, without rupture, unspecified: Secondary | ICD-10-CM

## 2023-10-09 NOTE — Progress Notes (Unsigned)
   Patient ID: [name], female   DOB: 1945-05-05, 78 y.o.   MRN: [id number]  Reason for Consult: No chief complaint on file.   Referred by Elliot Charm,*  Subjective:     HPI TANDI HANKO is a 78 y.o. female presenting for evaluation of an incidentally noted AAA.  This was per report identified on the CT scan but we do not have access to the imaging or the report.  She denies any new or unusual abdominal pain.  She denies any previous knowledge of this aneurysm.  She denies any family history of aneurysm.  She is a current smoker and smokes about 1/2 pack/day.  Past Medical History:  Diagnosis Date   Hypertension    Family History  Problem Relation Age of Onset   Breast cancer Cousin    Past Surgical History:  Procedure Laterality Date   BREAST BIOPSY     Pt states that she was in her 80s or 80s    Short Social History:  Social History   Tobacco Use   Smoking status: Every Day    Current packs/day: 0.50    Types: Cigarettes   Smokeless tobacco: Never  Substance Use Topics   Alcohol use: Yes    Allergies  Allergen Reactions   Sulfa Antibiotics     No current outpatient medications on file.   No current facility-administered medications for this visit.    REVIEW OF SYSTEMS  All other systems were reviewed and are negative     Objective:  Objective   There were no vitals filed for this visit. There is no height or weight on file to calculate BMI.  Physical Exam General: no acute distress Cardiac: hemodynamically stable Pulm: normal work of breathing Abdomen: non-tender, no pulsatile mass*** Neuro: alert, no focal deficit Extremities: no edema, cyanosis or wounds*** Vascular:   Right: ***  Left: ***  Data: AAA duplex ***  Reviewed notes from PCP Per report AAA measures 4.9 x 5.2 noted on CT scan from 08/17/2023     Assessment/Plan:   KORY PANJWANI is a 78 y.o. female with a ***cm AAA.  I explained that at this current size  she does not meet threshold for repair given that she is greater than 5 cm in a female.  I did explain that I would need to have a dedicated CTA in order to plan the surgery.  We briefly discussed the options of endovascular versus open.  She has a preference of endovascular. Will plan for a CTA and follow-up once this is complete in the next few weeks and at that time we can further discuss surgical options.  Recommendations to optimize cardiovascular risk: Abstinence from all tobacco products. Blood glucose control with goal A1c < 7%. Blood pressure control with goal blood pressure < 140/90 mmHg. Lipid reduction therapy with goal LDL-C <100 mg/dL  Aspirin 81mg  PO QD.  Atorvastatin 40-80mg  PO QD (or other high intensity statin therapy).   Norman GORMAN Serve MD [facility]

## 2023-10-11 ENCOUNTER — Ambulatory Visit: Attending: Vascular Surgery | Admitting: Vascular Surgery

## 2023-10-11 ENCOUNTER — Ambulatory Visit (HOSPITAL_COMMUNITY)
Admission: RE | Admit: 2023-10-11 | Discharge: 2023-10-11 | Disposition: A | Discharge status: Home / Self Care | Source: Ambulatory Visit | Attending: Vascular Surgery | Admitting: Vascular Surgery

## 2023-10-11 ENCOUNTER — Encounter: Payer: Self-pay | Admitting: Vascular Surgery

## 2023-10-11 VITALS — BP 134/67 | HR 77 | Temp 97.5°F | Resp 18 | Ht 65.0 in | Wt 139.2 lb

## 2023-10-11 DIAGNOSIS — I714 Abdominal aortic aneurysm, without rupture, unspecified: Secondary | ICD-10-CM | POA: Diagnosis present

## 2023-10-11 DIAGNOSIS — I7143 Infrarenal abdominal aortic aneurysm, without rupture: Secondary | ICD-10-CM

## 2023-10-14 ENCOUNTER — Other Ambulatory Visit: Payer: Self-pay

## 2023-10-14 DIAGNOSIS — I7143 Infrarenal abdominal aortic aneurysm, without rupture: Secondary | ICD-10-CM

## 2023-10-14 DIAGNOSIS — E785 Hyperlipidemia, unspecified: Secondary | ICD-10-CM | POA: Insufficient documentation

## 2023-10-14 DIAGNOSIS — I10 Essential (primary) hypertension: Secondary | ICD-10-CM | POA: Insufficient documentation

## 2023-11-22 ENCOUNTER — Ambulatory Visit: Admitting: Vascular Surgery

## 2023-12-03 ENCOUNTER — Ambulatory Visit (HOSPITAL_COMMUNITY)
Admission: RE | Admit: 2023-12-03 | Discharge: 2023-12-03 | Disposition: A | Discharge status: Home / Self Care | Source: Ambulatory Visit | Attending: Vascular Surgery | Admitting: Vascular Surgery

## 2023-12-03 DIAGNOSIS — I7143 Infrarenal abdominal aortic aneurysm, without rupture: Secondary | ICD-10-CM | POA: Diagnosis present

## 2023-12-03 MED ORDER — IOHEXOL 350 MG/ML SOLN
100.0000 mL | Freq: Once | INTRAVENOUS | Status: AC | PRN
  Administered 2023-12-03: 100 mL via INTRAVENOUS

## 2023-12-12 NOTE — Progress Notes (Unsigned)
   Patient ID: Erin Benson, female   DOB: [birth year], 78 y.o.   MRN: [id number]  Reason for Consult: No chief complaint on file.   Referred by Elliot Charm,*  Subjective:     HPI Erin Benson is a 78 y.o. female presenting for follow-up of AAA.  She was last seen in August with an aortic duplex with the finding of aneurysm greater than 5 cm.  She presents today for follow-up with a CTA.  She continues to be asymptomatic.  Past Medical History:  Diagnosis Date   AAA (abdominal aortic aneurysm)    Hyperlipidemia    Hypertension    Family History  Problem Relation Age of Onset   Breast cancer Cousin    Past Surgical History:  Procedure Laterality Date   BREAST BIOPSY     Pt states that she was in her 30s or 8s    Short Social History:  Social History   Tobacco Use   Smoking status: Every Day    Current packs/day: 0.50    Types: Cigarettes   Smokeless tobacco: Never  Substance Use Topics   Alcohol use: Yes    Allergies  Allergen Reactions   Sulfa Antibiotics     Current Outpatient Medications  Medication Sig Dispense Refill   atorvastatin (LIPITOR) 20 MG tablet Take 20 mg by mouth daily.     CARTIA XT 180 MG 24 hr capsule Take 180 mg by mouth daily.     hydrochlorothiazide (MICROZIDE) 12.5 MG capsule Take 12.5 mg by mouth daily.     No current facility-administered medications for this visit.    REVIEW OF SYSTEMS  All other systems were reviewed and are negative     Objective:  Objective   There were no vitals filed for this visit. There is no height or weight on file to calculate BMI.  Physical Exam General: no acute distress Abdomen: non-tender, no pulsatile mass palpated  Extremities: no edema, cyanosis or wounds Vascular:              Right: Palpable femoral, DP             Left: Palpable femoral, DP   Data: CTA independently reviewed.     Assessment/Plan:   Erin Benson is a 79 y.o. female with a 5.3 cm infrarenal AAA.   I discussed that at this time she meets threshold for repair which is greater than 5 cm in a female.  The CTA demonstrates that she would be an endovascular candidate and I offered EVAR.  We discussed the other option of local and explained that it is more durable although very invasive.  I did explain that I do believe we can effectively treat this aneurysm from an endovascular perspective which is her preference.  We discussed the risks and benefits of EVAR and she elected to proceed.  SVS AAA Surveillance Guidelines  < 3.0 cm  10 years 3.0 - 3.9 cm  3 years 4.0 - 4.9 cm  1 year   (consider repair at 5cm) 5.0 - 5.4 cm  6 months  Consider repair for women and low surgical risk men > 4.9cm Recommend repair if > 5.4 or growth > 1.0cm/yr or > 0.5cm/90month    Norman GORMAN Serve MD [facility]

## 2023-12-13 ENCOUNTER — Encounter: Payer: Self-pay | Admitting: Vascular Surgery

## 2023-12-13 ENCOUNTER — Ambulatory Visit: Attending: Vascular Surgery | Admitting: Vascular Surgery

## 2023-12-13 ENCOUNTER — Other Ambulatory Visit: Payer: Self-pay

## 2023-12-13 VITALS — BP 124/67 | HR 77 | Temp 98.1°F | Ht 65.0 in | Wt 145.0 lb

## 2023-12-13 DIAGNOSIS — I714 Abdominal aortic aneurysm, without rupture, unspecified: Secondary | ICD-10-CM

## 2023-12-13 DIAGNOSIS — I7143 Infrarenal abdominal aortic aneurysm, without rupture: Secondary | ICD-10-CM

## 2024-02-26 NOTE — Pre-Procedure Instructions (Signed)
 Surgical Instructions   Your procedure is scheduled on February 02, 2025. Report to [facility] Main Entrance A at 5:30 A.M., then check in with the Admitting office. Any questions or running late day of surgery: call [phone]  Questions prior to your surgery date: call [phone], Monday-Friday, 8am-4pm. If you experience any cold or flu symptoms such as cough, fever, chills, shortness of breath, etc. between now and your scheduled surgery, please notify us  at the above number.     Remember:  Do not eat or drink after midnight the night before your surgery. No gum, mints, or hard candy.      Take these medicines the morning of surgery with A SIP OF WATER: atorvastatin (LIPITOR)  CARTIA XT   May take these medicines IF NEEDED:  NONE  One week prior to surgery, STOP taking any Aspirin (unless otherwise instructed by your surgeon) Aleve, Naproxen, Ibuprofen, Motrin, Advil, Goody's, BC's, all herbal medications, fish oil, and non-prescription vitamins.                     Do NOT Smoke (Tobacco/Vaping) for 24 hours prior to your procedure.  If you use a CPAP at night, you may bring your mask/headgear for your overnight stay.   You will be asked to remove any contacts, glasses, piercing's, hearing aid's, dentures/partials prior to surgery. Please bring cases for these items if needed.    Patients discharged the day of surgery will not be allowed to drive home, and someone needs to stay with them for 24 hours.  SURGICAL WAITING ROOM VISITATION Patients may have no more than 2 support people in the waiting area - these visitors may rotate.   Pre-op nurse will coordinate an appropriate time for 1 ADULT support person, who may not rotate, to accompany patient in pre-op.  Children under the age of 66 must have an adult with them who is not the patient and must remain in the main waiting area with an adult.  If the patient needs to stay at the hospital during part of their  recovery, the visitor guidelines for inpatient rooms apply.  Please refer to the [facility] website for the visitor guidelines for any additional information.   If you received a COVID test during your pre-op visit  it is requested that you wear a mask when out in public, stay away from anyone that may not be feeling well and notify your surgeon if you develop symptoms. If you have been in contact with anyone that has tested positive in the last 10 days please notify you surgeon.      Pre-operative CHG Bathing Instructions   You can play a key role in reducing the risk of infection after surgery. Your skin needs to be as free of germs as possible. You can reduce the number of germs on your skin by washing with CHG (chlorhexidine gluconate) soap before surgery. CHG is an antiseptic soap that kills germs and continues to kill germs even after washing.   DO NOT use if you have an allergy to chlorhexidine/CHG or antibacterial soaps. If your skin becomes reddened or irritated, stop using the CHG and notify one of our RNs at [phone].              TAKE A SHOWER THE NIGHT BEFORE SURGERY   Please keep in mind the following:  DO NOT shave, including legs and underarms, 48 hours prior to surgery.   You may shave your face before/day of  surgery.  Place clean sheets on your bed the night before surgery Use a clean washcloth (not used since being washed) for shower. DO NOT sleep with pet's night before surgery.  CHG Shower Instructions:  Wash your face and private area with normal soap. If you choose to wash your hair, wash first with your normal shampoo.  After you use shampoo/soap, rinse your hair and body thoroughly to remove shampoo/soap residue.  Turn the water OFF and apply half the bottle of CHG soap to a CLEAN washcloth.  Apply CHG soap ONLY FROM YOUR NECK DOWN TO YOUR TOES (washing for 3-5 minutes)  DO NOT use CHG soap on face, private areas, open wounds, or sores.  Pay special  attention to the area where your surgery is being performed.  If you are having back surgery, having someone wash your back for you may be helpful. Wait 2 minutes after CHG soap is applied, then you may rinse off the CHG soap.  Pat dry with a clean towel  Put on clean pajamas    Additional instructions for the day of surgery: If you choose, you may shower the morning of surgery with an antibacterial soap.  DO NOT APPLY any lotions, deodorants, cologne, or perfumes.   Do not wear jewelry or makeup Do not wear nail polish, gel polish, artificial nails, or any other type of covering on natural nails (fingers and toes) Do not bring valuables to the hospital. [facility] is not responsible for valuables/personal belongings. Put on clean/comfortable clothes.  Please brush your teeth.  Ask your nurse before applying any prescription medications to the skin.

## 2024-02-27 ENCOUNTER — Encounter (HOSPITAL_COMMUNITY): Payer: Self-pay

## 2024-02-27 ENCOUNTER — Other Ambulatory Visit: Payer: Self-pay

## 2024-02-27 ENCOUNTER — Encounter (HOSPITAL_COMMUNITY)
Admission: RE | Admit: 2024-02-27 | Discharge: 2024-02-27 | Disposition: A | Discharge status: Home / Self Care | Source: Ambulatory Visit | Attending: Vascular Surgery | Admitting: Vascular Surgery

## 2024-02-27 VITALS — BP 129/62 | HR 85 | Temp 98.2°F | Resp 17 | Ht 65.0 in | Wt 143.3 lb

## 2024-02-27 DIAGNOSIS — I10 Essential (primary) hypertension: Secondary | ICD-10-CM | POA: Diagnosis not present

## 2024-02-27 DIAGNOSIS — Z01818 Encounter for other preprocedural examination: Secondary | ICD-10-CM | POA: Insufficient documentation

## 2024-02-27 DIAGNOSIS — I714 Abdominal aortic aneurysm, without rupture, unspecified: Secondary | ICD-10-CM | POA: Insufficient documentation

## 2024-02-27 LAB — CBC
HCT: 42.1 % (ref 36.0–46.0)
Hemoglobin: 13.8 g/dL (ref 12.0–15.0)
MCH: 23.9 pg — ABNORMAL LOW (ref 26.0–34.0)
MCHC: 32.8 g/dL (ref 30.0–36.0)
MCV: 72.8 fL — ABNORMAL LOW (ref 80.0–100.0)
Platelets: 299 K/uL (ref 150–400)
RBC: 5.78 MIL/uL — ABNORMAL HIGH (ref 3.87–5.11)
RDW: 16.2 % — ABNORMAL HIGH (ref 11.5–15.5)
WBC: 6.7 K/uL (ref 4.0–10.5)
nRBC: 0 % (ref 0.0–0.2)

## 2024-02-27 LAB — COMPREHENSIVE METABOLIC PANEL WITH GFR
ALT: 23 U/L (ref 0–44)
AST: 30 U/L (ref 15–41)
Albumin: 4.5 g/dL (ref 3.5–5.0)
Alkaline Phosphatase: 113 U/L (ref 38–126)
Anion gap: 9 (ref 5–15)
BUN: 16 mg/dL (ref 8–23)
CO2: 27 mmol/L (ref 22–32)
Calcium: 9.8 mg/dL (ref 8.9–10.3)
Chloride: 106 mmol/L (ref 98–111)
Creatinine, Ser: 0.85 mg/dL (ref 0.44–1.00)
GFR, Estimated: >60 mL/min
Glucose, Bld: 95 mg/dL (ref 70–99)
Potassium: 4 mmol/L (ref 3.5–5.1)
Sodium: 141 mmol/L (ref 135–145)
Total Bilirubin: 0.5 mg/dL (ref 0.0–1.2)
Total Protein: 8.1 g/dL (ref 6.5–8.1)

## 2024-02-27 LAB — URINALYSIS, ROUTINE W REFLEX MICROSCOPIC
Bilirubin Urine: NEGATIVE
Glucose, UA: NEGATIVE mg/dL
Hgb urine dipstick: NEGATIVE
Ketones, ur: NEGATIVE mg/dL
Leukocytes,Ua: NEGATIVE
Nitrite: NEGATIVE
Protein, ur: NEGATIVE mg/dL
Specific Gravity, Urine: 1.016 (ref 1.005–1.030)
pH: 6 (ref 5.0–8.0)

## 2024-02-27 LAB — PROTIME-INR
INR: 1 (ref 0.8–1.2)
Prothrombin Time: 13.8 s (ref 11.4–15.2)

## 2024-02-27 LAB — TYPE AND SCREEN
ABO/RH(D): A POS
Antibody Screen: NEGATIVE

## 2024-02-27 LAB — SURGICAL PCR SCREEN
MRSA, PCR: NEGATIVE
Staphylococcus aureus: NEGATIVE

## 2024-02-27 LAB — APTT: aPTT: 29 s (ref 24–36)

## 2024-02-27 NOTE — Progress Notes (Signed)
 PCP - Dr. Valery Ripple Cardiologist - denies  PPM/ICD - n/a Device Orders -  Rep Notified -   Chest x-ray - n/a EKG - 02/27/23 Stress Test - denies ECHO - denies Cardiac Cath - denies  Sleep Study - denies CPAP -   Fasting Blood Sugar - n/a  Checks Blood Sugar _____ times a day  Last dose of GLP1 agonist- n/a   GLP1 instructions:   Blood Thinner Instructions: n/a Aspirin  Instructions:  ERAS Protcol - NPO PRE-SURGERY Ensure or G2-   COVID TEST- n/a   Anesthesia review:   Patient denies shortness of breath, fever, cough and chest pain at PAT appointment

## 2024-03-04 NOTE — Anesthesia Preprocedure Evaluation (Addendum)
"                                    Anesthesia Evaluation  Patient identified by MRN, date of birth, ID band Patient awake    Reviewed: Allergy & Precautions, NPO status , Patient's Chart, lab work & pertinent test results  Airway Mallampati: II  TM Distance: >3 FB Neck ROM: Full    Dental  (+) Dental Advisory Given   Pulmonary neg recent URI, former smoker   Pulmonary exam normal breath sounds clear to auscultation       Cardiovascular hypertension, Pt. on medications Normal cardiovascular exam Rhythm:Regular Rate:Normal     Neuro/Psych negative neurological ROS     GI/Hepatic Neg liver ROS,GERD  Controlled,,  Endo/Other  negative endocrine ROS    Renal/GU negative Renal ROS     Musculoskeletal negative musculoskeletal ROS (+)    Abdominal   Peds  Hematology negative hematology ROS (+)   Anesthesia Other Findings   Reproductive/Obstetrics                              Anesthesia Physical Anesthesia Plan  ASA: 4  Anesthesia Plan: General   Post-op Pain Management: Tylenol  PO (pre-op)*   Induction: Intravenous  PONV Risk Score and Plan: 3 and Ondansetron , Dexamethasone  and Treatment may vary due to age or medical condition  Airway Management Planned: Oral ETT  Additional Equipment: Arterial line  Intra-op Plan:   Post-operative Plan: Extubation in OR  Informed Consent: I have reviewed the patients History and Physical, chart, labs and discussed the procedure including the risks, benefits and alternatives for the proposed anesthesia with the patient or authorized representative who has indicated his/her understanding and acceptance.     Dental advisory given  Plan Discussed with: CRNA  Anesthesia Plan Comments: (2 x PIV vs CVL  Risks of anesthesia explained at length. This includes, but is not limited to, sore throat, damage to teeth, lips gums, tongue and vocal cords, nausea and vomiting, reactions to  medications, stroke, heart attack, and death. All patient questions were answered and the patient wishes to proceed. )         Anesthesia Quick Evaluation  "

## 2024-03-05 ENCOUNTER — Inpatient Hospital Stay (HOSPITAL_COMMUNITY): Admitting: Anesthesiology

## 2024-03-05 ENCOUNTER — Inpatient Hospital Stay (HOSPITAL_COMMUNITY)

## 2024-03-05 ENCOUNTER — Encounter (HOSPITAL_COMMUNITY): Admission: RE | Disposition: A | Payer: Self-pay | Source: Home / Self Care | Attending: Vascular Surgery

## 2024-03-05 ENCOUNTER — Inpatient Hospital Stay (HOSPITAL_COMMUNITY)
Admission: RE | Admit: 2024-03-05 | Discharge: 2024-03-06 | DRG: 269 | Disposition: A | Discharge status: Home / Self Care | Attending: Vascular Surgery | Admitting: Vascular Surgery

## 2024-03-05 ENCOUNTER — Encounter (HOSPITAL_COMMUNITY): Payer: Self-pay | Admitting: Vascular Surgery

## 2024-03-05 DIAGNOSIS — I7143 Infrarenal abdominal aortic aneurysm, without rupture: Principal | ICD-10-CM | POA: Diagnosis present

## 2024-03-05 DIAGNOSIS — Z8679 Personal history of other diseases of the circulatory system: Principal | ICD-10-CM

## 2024-03-05 DIAGNOSIS — Z882 Allergy status to sulfonamides status: Secondary | ICD-10-CM

## 2024-03-05 DIAGNOSIS — I714 Abdominal aortic aneurysm, without rupture, unspecified: Secondary | ICD-10-CM

## 2024-03-05 DIAGNOSIS — I10 Essential (primary) hypertension: Secondary | ICD-10-CM | POA: Diagnosis present

## 2024-03-05 DIAGNOSIS — Z87891 Personal history of nicotine dependence: Secondary | ICD-10-CM | POA: Diagnosis not present

## 2024-03-05 DIAGNOSIS — Z803 Family history of malignant neoplasm of breast: Secondary | ICD-10-CM | POA: Diagnosis not present

## 2024-03-05 DIAGNOSIS — E785 Hyperlipidemia, unspecified: Secondary | ICD-10-CM | POA: Diagnosis present

## 2024-03-05 DIAGNOSIS — Z79899 Other long term (current) drug therapy: Secondary | ICD-10-CM

## 2024-03-05 HISTORY — PX: ABDOMINAL AORTIC ENDOVASCULAR STENT GRAFT: SHX5707

## 2024-03-05 HISTORY — PX: ULTRASOUND GUIDANCE FOR VASCULAR ACCESS: SHX6516

## 2024-03-05 LAB — BASIC METABOLIC PANEL WITH GFR
Anion gap: 8 (ref 5–15)
BUN: 10 mg/dL (ref 8–23)
CO2: 23 mmol/L (ref 22–32)
Calcium: 8.3 mg/dL — ABNORMAL LOW (ref 8.9–10.3)
Chloride: 106 mmol/L (ref 98–111)
Creatinine, Ser: 0.66 mg/dL (ref 0.44–1.00)
GFR, Estimated: >60 mL/min
Glucose, Bld: 118 mg/dL — ABNORMAL HIGH (ref 70–99)
Potassium: 3.5 mmol/L (ref 3.5–5.1)
Sodium: 138 mmol/L (ref 135–145)

## 2024-03-05 LAB — CBC
HCT: 31.9 % — ABNORMAL LOW (ref 36.0–46.0)
Hemoglobin: 10.7 g/dL — ABNORMAL LOW (ref 12.0–15.0)
MCH: 23.9 pg — ABNORMAL LOW (ref 26.0–34.0)
MCHC: 33.5 g/dL (ref 30.0–36.0)
MCV: 71.2 fL — ABNORMAL LOW (ref 80.0–100.0)
Platelets: 214 K/uL (ref 150–400)
RBC: 4.48 MIL/uL (ref 3.87–5.11)
RDW: 15.7 % — ABNORMAL HIGH (ref 11.5–15.5)
WBC: 5.9 K/uL (ref 4.0–10.5)
nRBC: 0 % (ref 0.0–0.2)

## 2024-03-05 LAB — POCT ACTIVATED CLOTTING TIME
Activated Clotting Time: 199 s
Activated Clotting Time: 214 s
Activated Clotting Time: 219 s

## 2024-03-05 LAB — PROTIME-INR
INR: 1.2 (ref 0.8–1.2)
Prothrombin Time: 15.5 s — ABNORMAL HIGH (ref 11.4–15.2)

## 2024-03-05 LAB — APTT: aPTT: 32 s (ref 24–36)

## 2024-03-05 LAB — ABO/RH: ABO/RH(D): A POS

## 2024-03-05 LAB — MAGNESIUM: Magnesium: 1.8 mg/dL (ref 1.7–2.4)

## 2024-03-05 MED ORDER — DEXAMETHASONE SOD PHOSPHATE PF 10 MG/ML IJ SOLN
INTRAMUSCULAR | Status: DC | PRN
  Administered 2024-03-05: 5 mg via INTRAVENOUS

## 2024-03-05 MED ORDER — CEFAZOLIN SODIUM-DEXTROSE 2-4 GM/100ML-% IV SOLN
2.0000 g | Freq: Three times a day (TID) | INTRAVENOUS | Status: AC
  Administered 2024-03-05 (×2): 2 g via INTRAVENOUS
  Filled 2024-03-05 (×2): qty 100

## 2024-03-05 MED ORDER — POTASSIUM CHLORIDE CRYS ER 20 MEQ PO TBCR: 40.0000 meq | EXTENDED_RELEASE_TABLET | Freq: Every day | ORAL | Status: DC | PRN

## 2024-03-05 MED ORDER — LIDOCAINE 2% (20 MG/ML) 5 ML SYRINGE
INTRAMUSCULAR | Status: AC
  Filled 2024-03-05: qty 5

## 2024-03-05 MED ORDER — CEFAZOLIN SODIUM-DEXTROSE 2-4 GM/100ML-% IV SOLN
2.0000 g | INTRAVENOUS | Status: AC
  Administered 2024-03-05: 2 g via INTRAVENOUS
  Filled 2024-03-05: qty 100

## 2024-03-05 MED ORDER — LACTATED RINGERS IV SOLN: INTRAVENOUS | Status: DC

## 2024-03-05 MED ORDER — FENTANYL CITRATE (PF) 100 MCG/2ML IJ SOLN
25.0000 ug | INTRAMUSCULAR | Status: DC | PRN
  Administered 2024-03-05: 25 ug via INTRAVENOUS

## 2024-03-05 MED ORDER — LIDOCAINE 2% (20 MG/ML) 5 ML SYRINGE
INTRAMUSCULAR | Status: DC | PRN
  Administered 2024-03-05: 60 mg via INTRAVENOUS

## 2024-03-05 MED ORDER — DEXMEDETOMIDINE HCL IN NACL 80 MCG/20ML IV SOLN
INTRAVENOUS | Status: AC
  Filled 2024-03-05: qty 20

## 2024-03-05 MED ORDER — FENTANYL CITRATE (PF) 250 MCG/5ML IJ SOLN
INTRAMUSCULAR | Status: DC | PRN
  Administered 2024-03-05 (×2): 50 ug via INTRAVENOUS

## 2024-03-05 MED ORDER — HEPARIN SODIUM (PORCINE) 1000 UNIT/ML IJ SOLN
INTRAMUSCULAR | Status: AC
  Filled 2024-03-05: qty 10

## 2024-03-05 MED ORDER — IODIXANOL 320 MG/ML IV SOLN
INTRAVENOUS | Status: DC | PRN
  Administered 2024-03-05: 139 mL

## 2024-03-05 MED ORDER — ACETAMINOPHEN 325 MG PO TABS: 325.0000 mg | ORAL_TABLET | ORAL | Status: DC | PRN

## 2024-03-05 MED ORDER — ONDANSETRON HCL 4 MG/2ML IJ SOLN
INTRAMUSCULAR | Status: AC
  Filled 2024-03-05: qty 2

## 2024-03-05 MED ORDER — PROPOFOL 10 MG/ML IV BOLUS
INTRAVENOUS | Status: DC | PRN
  Administered 2024-03-05: 100 mg via INTRAVENOUS

## 2024-03-05 MED ORDER — DILTIAZEM HCL ER COATED BEADS 180 MG PO CP24
180.0000 mg | ORAL_CAPSULE | Freq: Every day | ORAL | Status: DC
  Administered 2024-03-06: 180 mg via ORAL
  Filled 2024-03-05: qty 1

## 2024-03-05 MED ORDER — ACETAMINOPHEN 10 MG/ML IV SOLN
INTRAVENOUS | Status: AC
  Filled 2024-03-05: qty 100

## 2024-03-05 MED ORDER — BISACODYL 5 MG PO TBEC: 5.0000 mg | DELAYED_RELEASE_TABLET | Freq: Every day | ORAL | Status: DC | PRN

## 2024-03-05 MED ORDER — PANTOPRAZOLE SODIUM 40 MG PO TBEC
40.0000 mg | DELAYED_RELEASE_TABLET | Freq: Every day | ORAL | Status: DC
  Administered 2024-03-05 – 2024-03-06 (×2): 40 mg via ORAL
  Filled 2024-03-05 (×2): qty 1

## 2024-03-05 MED ORDER — CHLORHEXIDINE GLUCONATE 0.12 % MT SOLN
15.0000 mL | Freq: Once | OROMUCOSAL | Status: AC
  Administered 2024-03-05: 15 mL via OROMUCOSAL
  Filled 2024-03-05: qty 15

## 2024-03-05 MED ORDER — CHLORHEXIDINE GLUCONATE CLOTH 2 % EX PADS: 6.0000 | MEDICATED_PAD | Freq: Once | CUTANEOUS | Status: DC

## 2024-03-05 MED ORDER — DOCUSATE SODIUM 100 MG PO CAPS: 100.0000 mg | ORAL_CAPSULE | Freq: Every day | ORAL | Status: DC

## 2024-03-05 MED ORDER — HYDROCHLOROTHIAZIDE 12.5 MG PO TABS
12.5000 mg | ORAL_TABLET | Freq: Every day | ORAL | Status: DC
  Administered 2024-03-05 – 2024-03-06 (×2): 12.5 mg via ORAL
  Filled 2024-03-05 (×4): qty 1

## 2024-03-05 MED ORDER — DEXMEDETOMIDINE HCL IN NACL 80 MCG/20ML IV SOLN
INTRAVENOUS | Status: DC | PRN
  Administered 2024-03-05: 8 ug via INTRAVENOUS

## 2024-03-05 MED ORDER — ASPIRIN 81 MG PO TBEC
81.0000 mg | DELAYED_RELEASE_TABLET | Freq: Every day | ORAL | Status: DC
  Administered 2024-03-06: 81 mg via ORAL
  Filled 2024-03-05: qty 1

## 2024-03-05 MED ORDER — 0.9 % SODIUM CHLORIDE (POUR BTL) OPTIME
TOPICAL | Status: DC | PRN
  Administered 2024-03-05: 1000 mL

## 2024-03-05 MED ORDER — HEPARIN 6000 UNIT IRRIGATION SOLUTION
Status: DC | PRN
  Administered 2024-03-05: 1

## 2024-03-05 MED ORDER — ACETAMINOPHEN 650 MG RE SUPP: 325.0000 mg | RECTAL | Status: DC | PRN

## 2024-03-05 MED ORDER — PROPOFOL 10 MG/ML IV BOLUS
INTRAVENOUS | Status: AC
  Filled 2024-03-05: qty 20

## 2024-03-05 MED ORDER — ROCURONIUM BROMIDE 10 MG/ML (PF) SYRINGE
PREFILLED_SYRINGE | INTRAVENOUS | Status: AC
  Filled 2024-03-05: qty 10

## 2024-03-05 MED ORDER — OXYCODONE-ACETAMINOPHEN 5-325 MG PO TABS: 1.0000 | ORAL_TABLET | ORAL | Status: DC | PRN

## 2024-03-05 MED ORDER — ONDANSETRON HCL 4 MG/2ML IJ SOLN
INTRAMUSCULAR | Status: DC | PRN
  Administered 2024-03-05: 4 mg via INTRAVENOUS

## 2024-03-05 MED ORDER — ACETAMINOPHEN 10 MG/ML IV SOLN
INTRAVENOUS | Status: DC | PRN
  Administered 2024-03-05: 1000 mg via INTRAVENOUS

## 2024-03-05 MED ORDER — PROTAMINE SULFATE 10 MG/ML IV SOLN
INTRAVENOUS | Status: DC | PRN
  Administered 2024-03-05: 30 mg via INTRAVENOUS
  Administered 2024-03-05: 10 mg via INTRAVENOUS

## 2024-03-05 MED ORDER — PHENYLEPHRINE 80 MCG/ML (10ML) SYRINGE FOR IV PUSH (FOR BLOOD PRESSURE SUPPORT)
PREFILLED_SYRINGE | INTRAVENOUS | Status: AC
  Filled 2024-03-05: qty 10

## 2024-03-05 MED ORDER — FENTANYL CITRATE (PF) 100 MCG/2ML IJ SOLN
INTRAMUSCULAR | Status: AC
  Filled 2024-03-05: qty 2

## 2024-03-05 MED ORDER — ALBUMIN HUMAN 5 % IV SOLN: INTRAVENOUS | Status: DC | PRN

## 2024-03-05 MED ORDER — HEPARIN SODIUM (PORCINE) 5000 UNIT/ML IJ SOLN
5000.0000 [IU] | Freq: Three times a day (TID) | INTRAMUSCULAR | Status: DC
  Administered 2024-03-06: 5000 [IU] via SUBCUTANEOUS
  Filled 2024-03-05: qty 1

## 2024-03-05 MED ORDER — ATORVASTATIN CALCIUM 10 MG PO TABS
20.0000 mg | ORAL_TABLET | Freq: Every day | ORAL | Status: DC
  Administered 2024-03-05 – 2024-03-06 (×2): 20 mg via ORAL
  Filled 2024-03-05 (×2): qty 2

## 2024-03-05 MED ORDER — PHENYLEPHRINE HCL-NACL 20-0.9 MG/250ML-% IV SOLN
INTRAVENOUS | Status: DC | PRN
  Administered 2024-03-05: 10 ug/min via INTRAVENOUS

## 2024-03-05 MED ORDER — ROCURONIUM BROMIDE 10 MG/ML (PF) SYRINGE
PREFILLED_SYRINGE | INTRAVENOUS | Status: DC | PRN
  Administered 2024-03-05: 50 mg via INTRAVENOUS
  Administered 2024-03-05: 10 mg via INTRAVENOUS

## 2024-03-05 MED ORDER — HEPARIN SODIUM (PORCINE) 1000 UNIT/ML IJ SOLN
INTRAMUSCULAR | Status: DC | PRN
  Administered 2024-03-05: 1000 [IU] via INTRAVENOUS
  Administered 2024-03-05: 2000 [IU] via INTRAVENOUS
  Administered 2024-03-05: 6000 [IU] via INTRAVENOUS

## 2024-03-05 MED ORDER — PHENYLEPHRINE 80 MCG/ML (10ML) SYRINGE FOR IV PUSH (FOR BLOOD PRESSURE SUPPORT)
PREFILLED_SYRINGE | INTRAVENOUS | Status: DC | PRN
  Administered 2024-03-05: 120 ug via INTRAVENOUS

## 2024-03-05 MED ORDER — SODIUM CHLORIDE 0.9 % IV SOLN: INTRAVENOUS | Status: DC

## 2024-03-05 MED ORDER — METOPROLOL TARTRATE 5 MG/5ML IV SOLN: 2.5000 mg | INTRAVENOUS | Status: DC | PRN

## 2024-03-05 MED ORDER — EPHEDRINE SULFATE-NACL 50-0.9 MG/10ML-% IV SOSY
PREFILLED_SYRINGE | INTRAVENOUS | Status: DC | PRN
  Administered 2024-03-05: 5 mg via INTRAVENOUS

## 2024-03-05 MED ORDER — PHENOL 1.4 % MT LIQD: 1.0000 | OROMUCOSAL | Status: DC | PRN

## 2024-03-05 MED ORDER — ORAL CARE MOUTH RINSE: 15.0000 mL | Freq: Once | OROMUCOSAL | Status: AC

## 2024-03-05 MED ORDER — DEXAMETHASONE SOD PHOSPHATE PF 10 MG/ML IJ SOLN
INTRAMUSCULAR | Status: AC
  Filled 2024-03-05: qty 1

## 2024-03-05 MED ORDER — SUGAMMADEX SODIUM 200 MG/2ML IV SOLN
INTRAVENOUS | Status: DC | PRN
  Administered 2024-03-05: 200 mg via INTRAVENOUS

## 2024-03-05 MED ORDER — MORPHINE SULFATE (PF) 2 MG/ML IV SOLN: 2.0000 mg | INTRAVENOUS | Status: DC | PRN

## 2024-03-05 MED ORDER — PROTAMINE SULFATE 10 MG/ML IV SOLN
INTRAVENOUS | Status: AC
  Filled 2024-03-05: qty 5

## 2024-03-05 MED ORDER — HEPARIN 6000 UNIT IRRIGATION SOLUTION
Status: AC
  Filled 2024-03-05: qty 500

## 2024-03-05 MED ORDER — POLYETHYLENE GLYCOL 3350 17 G PO PACK: 17.0000 g | PACK | Freq: Every day | ORAL | Status: DC | PRN

## 2024-03-05 NOTE — Transfer of Care (Signed)
 Immediate Anesthesia Transfer of Care Note  Patient: Erin Benson  Procedure(s) Performed: INSERTION, ENDOVASCULAR STENT GRAFT, AORTA, ABDOMINAL ULTRASOUND GUIDANCE, FOR VASCULAR ACCESS (Bilateral: Groin)  Patient Location: PACU  Anesthesia Type:General  Level of Consciousness: drowsy and responds to stimulation  Airway & Oxygen Therapy: Patient Spontanous Breathing  Post-op Assessment: Report given to RN and Post -op Vital signs reviewed and stable  Post vital signs: Reviewed and stable  Last Vitals:  Vitals Value Taken Time  BP 120/49 03/05/24 09:53  Temp 36.4 C 03/05/24 09:53  Pulse 59 03/05/24 09:58  Resp 13 03/05/24 09:58  SpO2 94 % 03/05/24 09:58  Vitals shown include unfiled device data.  Last Pain:  Vitals:   03/05/24 0612  TempSrc: Oral  PainSc:          Complications: No notable events documented.

## 2024-03-05 NOTE — Anesthesia Procedure Notes (Signed)
 Procedure Name: Intubation Date/Time: 03/05/2024 7:50 AM  Performed by: Virgil Ee, CRNAPre-anesthesia Checklist: Patient identified, Patient being monitored, Timeout performed, Emergency Drugs available and Suction available Patient Re-evaluated:Patient Re-evaluated prior to induction Oxygen Delivery Method: Circle system utilized Preoxygenation: Pre-oxygenation with 100% oxygen Induction Type: IV induction Ventilation: Mask ventilation without difficulty Laryngoscope Size: Mac and 4 Grade View: Grade II Tube type: Oral Tube size: 6.5 mm Number of attempts: 1 Airway Equipment and Method: Stylet Placement Confirmation: ETT inserted through vocal cords under direct vision, positive ETCO2 and breath sounds checked- equal and bilateral Secured at: 20 cm Tube secured with: Tape Dental Injury: Teeth and Oropharynx as per pre-operative assessment

## 2024-03-05 NOTE — Anesthesia Procedure Notes (Signed)
 Arterial Line Insertion Start/End1/15/2026 7:10 AM, 03/05/2024 7:20 AM Performed by: Zelphia Norleen HERO, CRNA, CRNA  Patient location: Pre-op. Preanesthetic checklist: patient identified, IV checked, site marked, risks and benefits discussed, surgical consent, monitors and equipment checked, pre-op evaluation, timeout performed and anesthesia consent Lidocaine  1% used for infiltration radial was placed Catheter size: 20 G Hand hygiene performed  and maximum sterile barriers used   Attempts: 1 Procedure performed using ultrasound to evaluate access site. Ultrasound Notes:relevant anatomy identified and ultrasound used to visualize needle entry. Following insertion, dressing applied and Biopatch. Post procedure assessment: normal and unchanged  Patient tolerated the procedure well with no immediate complications.

## 2024-03-05 NOTE — H&P (Signed)
 Patient seen and examined.  No complaints. No changes to medication history or physical exam since last seen. After discussing the risks and benefits of EVAR, Erin Benson elected to proceed.   Erin GORMAN Serve MD         Patient ID: Erin Benson, female   DOB: 11/25/1945, 79 y.o.   MRN: [id number]   Reason for Consult: Follow-up   Referred by Erin Benson,*   Subjective:    Subjective HPI Erin Benson is a 79 y.o. female presenting for follow-up of AAA.  She was last seen in August with an aortic duplex with the finding of aneurysm greater than 5 cm.  She presents today for follow-up with a CTA.  She continues to be asymptomatic.       Past Medical History:  Diagnosis Date   AAA (abdominal aortic aneurysm)     Hyperlipidemia     Hypertension               Family History  Problem Relation Age of Onset   Breast cancer Cousin               Past Surgical History:  Procedure Laterality Date   BREAST BIOPSY        Pt states that she was in her 30s or 71s          Short Social History:  Social History         Tobacco Use   Smoking status: Former      Current packs/day: 0.00      Average packs/day: 0.5 packs/day for 30.0 years (15.0 ttl pk-yrs)      Types: Cigarettes      Start date: 12/12/1993      Quit date: 12/04/2023      Years since quitting: 0.0   Smokeless tobacco: Never  Substance Use Topics   Alcohol use: Yes      Allergies      Allergies  Allergen Reactions   Sulfa Antibiotics                Current Outpatient Medications  Medication Sig Dispense Refill   atorvastatin  (LIPITOR) 20 MG tablet Take 20 mg by mouth daily.       CARTIA  XT 180 MG 24 hr capsule Take 180 mg by mouth daily.       hydrochlorothiazide  (MICROZIDE ) 12.5 MG capsule Take 12.5 mg by mouth daily.          No current facility-administered medications for this visit.        REVIEW OF SYSTEMS  All other systems were reviewed and are negative        Objective:    Objective[] Expand by Default    Vitals:    12/13/23 1346  BP: 124/67  Pulse: 77  Temp: 98.1 F (36.7 C)  SpO2: 96%  Weight: 145 lb (65.8 kg)  Height: 5' 5 (1.651 m)    Body mass index is 24.13 kg/m.   Physical Exam General: no acute distress Abdomen: non-tender, no pulsatile mass palpated  Extremities: no edema, cyanosis or wounds Vascular:              Right: Palpable femoral, DP             Left: Palpable femoral, DP     Data: CTA independently reviewed.      Assessment/Plan:    Erin Benson is a 79 y.o. female with a 5.3 cm infrarenal AAA.  I discussed  that at this time she meets threshold for repair which is greater than 5 cm in a female.  The CTA demonstrates that she would be an endovascular candidate and I offered EVAR.  We discussed the other option of local and explained that it is more durable although very invasive.  I did explain that I do believe we can effectively treat this aneurysm from an endovascular perspective which is her preference.   We discussed the risks and benefits of EVAR and she elected to proceed.   She is currently taking care of her husband who has Alzheimer's and would like to proceed with surgery after the holidays sometime in January.   SVS AAA Surveillance Guidelines  < 3.0 cm                      10 years 3.0 - 3.9 cm                 3 years 4.0 - 4.9 cm                 1 year   (consider repair at 5cm) 5.0 - 5.4 cm                 6 months   Consider repair for women and low surgical risk men > 4.9cm Recommend repair if > 5.4 or growth > 1.0cm/yr or > 0.5cm/55month       Erin GORMAN Serve MD [facility]

## 2024-03-05 NOTE — Progress Notes (Addendum)
" °  Day of Surgery Note    Subjective:  resting in recovery; no complaints   Vitals:   03/05/24 1345 03/05/24 1400  BP: (!) 129/58 (!) 135/58  Pulse: 71 68  Resp: 20 18  Temp:  98.5 F (36.9 C)  SpO2: 91% 94%    Incisions:   bilateral groins are soft without hematoma Extremities:  palpable DP pulses bilaterally Cardiac:  regular Lungs:  non labored    Assessment/Plan:  This is a 79 y.o. female who is s/p  EVAR for 5.3cm asx AAA on 03/05/2024 by Dr. Pearline  -doing well in recovery with palpable pedal pulses and bilateral groins are soft without hematoma.   -continue asa/statin.  She states she just bought a new bottle of asa. -to 4E -anticipate discharge tomorrow if no issues overnight. -follow up has been arranged for 2/20 with Dr. Pearline -she has not required any pain medication.  Discussed with her that it will be sent to her pharmacy and she can pick it up if she needs it but she does not have to get it if she does not need it.  She expressed good understanding.    Lucie Apt, PA-C 03/05/2024 2:33 PM [phone] "

## 2024-03-05 NOTE — Discharge Instructions (Signed)
   [facility]   Discharge Instructions  Endovascular Aortic Aneurysm Repair  Please refer to the following instructions for your post-procedure care. Your surgeon or Physician Assistant will discuss any changes with you.  Activity  You are encouraged to walk as much as you can. You can slowly return to normal activities but must avoid strenuous activity and heavy lifting until your doctor tells you it's OK. Avoid activities such as vacuuming or swinging a gold club. It is normal to feel tired for several weeks after your surgery. Do not drive until your doctor gives the OK and you are no longer taking prescription pain medications. It is also normal to have difficulty with sleep habits, eating, and bowel movements after surgery. These will go away with time.  Bathing/Showering  You may shower after you go home. If you have an incision, do not soak in a bathtub, hot tub, or swim until the incision heals completely.  Incision Care  Shower every day. Clean your incision with mild soap and water. Pat the area dry with a clean towel. You do not need a bandage unless otherwise instructed. Do not apply any ointments or creams to your incision. If you clothing is irritating, you may cover your incision with a dry gauze pad.  Diet  Resume your normal diet. There are no special food restrictions following this procedure. A low fat/low cholesterol diet is recommended for all patients with vascular disease. In order to heal from your surgery, it is CRITICAL to get adequate nutrition. Your body requires vitamins, minerals, and protein. Vegetables are the best source of vitamins and minerals. Vegetables also provide the perfect balance of protein. Processed food has little nutritional value, so try to avoid this.  Medications  Resume taking all of your medications unless your doctor or nurse practitioner tells you not to. If your incision is causing pain, you may take  over-the-counter pain relievers such as acetaminophen (Tylenol). If you were prescribed a stronger pain medication, please be aware these medications can cause nausea and constipation. Prevent nausea by taking the medication with a snack or meal. Avoid constipation by drinking plenty of fluids and eating foods with a high amount of fiber, such as fruits, vegetables, and grains. Do not take Tylenol if you are taking prescription pain medications.   Follow up  [facility] office will schedule a follow-up appointment with a C.T. scan 3-4 weeks after your surgery.  Please call us immediately for any of the following conditions  Severe or worsening pain in your legs or feet or in your abdomen back or chest. Increased pain, redness, drainage (pus) from your incision sit. Increased abdominal pain, bloating, nausea, vomiting or persistent diarrhea. Fever of 101 degrees or higher. Swelling in your leg (s),  Reduce your risk of vascular disease  Stop smoking. If you would like help call [facility] at 1-800-QUIT-NOW [phone]) or [place] at [phone]. Manage your cholesterol Maintain a desired weight Control your diabetes Keep your blood pressure down  If you have questions, please call the office at [phone].

## 2024-03-05 NOTE — Op Note (Signed)
 "   OPERATIVE NOTE  PROCEDURE:   Ultrasound-guided access of bilateral common femoral arteries with Pro-glide preclose technique [id number] Endovascular aortic repair with Aortobi-iliac stent graft [id number] Aortic extension placement [id number]  PRE-OPERATIVE DIAGNOSIS: 5.3cm asymptomatic AAA (non-ruptured)  POST-OPERATIVE DIAGNOSIS: same as above   SURGEON: Norman GORMAN Serve MD  ASSISTANT(S): Adina Sender, PA  Given the complexity of the case,  the assistant was necessary in order to expedient the procedure and safely perform the technical aspects of the operation.  The assistant played a critical role in pinning wires while and tracking the stent graft devices and advancing the aortic balloon. These skills could not have been adequately performed by a scrub tech assistant.    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  Therefore an aortic cuff was placed from the opposite side in order to get a better angle and have the cuff seat on the right wall.  This landed in excellent position.  On completion there was wide patency of the renal arteries and iliac arteries and no evidence of endoleak.  SPECIMEN(S): None  INDICATIONS:   Erin Benson is a 79 y.o. female with a 5.3cm AAA.  We reviewed risks and benefits of intervention including endovascular repair versus open repair as well as continued surveillance with best medical therapy.  Given his anatomy and medical comorbidities I offered EVAR.  We reviewed the specific risks of bleeding, renal obstruction leading to renal failure, bowel ischemia, access site complication necessitating a groin incision, rupture and death.  The patient expressed understanding and was willing to proceed.  DESCRIPTION: The  patient was brought to the operating room and positioned supine on the OR table. The patient was prepped and draped in the usual sterile fashion. A timeout was performed and IV antibiotics were administered.  Bilateral percutaneous access of the CFA was obtained with ultrasound guidance.  Both access ease were dilated with an 8 French dilator and 2 perclose devices were placed in the pre-close technique and bilateral iliac systems were upsized to 8 French sheaths. The patient was systemically heparinized. Via the right access, the Bentson wire was exchanged for a Lunderquist and the 73F sheath was upsized to a 18 french dry-seal and the device (Gore 28 x 14 x 12 cm) was placed through this and into position. A pigtail catheter was place through the left access and an aortogram was obtained. This demonstrated widely patent renal arteries bilaterally and an aortic neck of adequate length, but mildly diseased with a conical shape. The renals were marked on the screen and the graft was deployed in the infrarenal position. An aortogram was obtained and confirmed the graft was in good position just inferior to the lowest renal artery. The contralateral limb was selected with a catheter and soft glide wire. Once the wire was advanced through the graft a pigtail catheter was placed on the wire. The wire was withdrawn and the Omni Flush was spun and noted to be freely moving and also of the flow divider of the graft was hooked with the Omni Flush also confirming that we were truly cannulated. A 12 french sheath was then advanced into the contralateral gate over a Lunderquist wire. A 16 mm x 11.5 cm limb was inserted  via the left access and deployed per the IFU after an angiogram was used to mark the hypo. The reminder of the main body was deployed. Via the right access a 14 mm x 10cm limb was advanced to extend the main body ipsilateral side to the proximal edge of the hypo, this was deployed according to IFU. A compliant  balloon was used to mold the main body proximal and distal seal zones as well as the contralateral overlap. A completion aortogram was obtained which revealed patent renal arteries, hypogastric arteries but with a type Ia endoleak due to the right side of the graft slipping down.  In order to get a different angle in the neck we elected to place a cuff from the left side.  The 12 French dry seal sheath was exchanged for a 16 French dry seal sheath and a 28 mm x 4.5 cm Gore aortic cuff was placed in an angulated fashion in order to get a better seal on the right side of the aortic wall.  This was placed according to IFU and reballooned with the compliant aortic balloon.  A completion angiogram demonstrated resolution of the 1A endoleak with patent renal arteries bilaterally.  This was felt to be an excellent result.  The arteriotomies were closed over a wire using the previously placed perc-close devices with excellent hemostasis. Pedal pulses were checked and found to be stable from pre-op. Heparin  was reversed with protamine  and the skin was closed with 4-0 monocryl suture. Dry sterile dressing was placed. The patient was transported to the recovery room in stable condition.    COMPLICATIONS: None apparent  CONDITION: Stable  Norman GORMAN Serve MD [facility] Phone Number: [phone] 03/05/2024 9:55 AM     "

## 2024-03-06 ENCOUNTER — Encounter (HOSPITAL_COMMUNITY): Payer: Self-pay | Admitting: Vascular Surgery

## 2024-03-06 LAB — BASIC METABOLIC PANEL WITH GFR
Anion gap: 9 (ref 5–15)
BUN: 8 mg/dL (ref 8–23)
CO2: 23 mmol/L (ref 22–32)
Calcium: 8.8 mg/dL — ABNORMAL LOW (ref 8.9–10.3)
Chloride: 106 mmol/L (ref 98–111)
Creatinine, Ser: 0.65 mg/dL (ref 0.44–1.00)
GFR, Estimated: >60 mL/min
Glucose, Bld: 138 mg/dL — ABNORMAL HIGH (ref 70–99)
Potassium: 3.6 mmol/L (ref 3.5–5.1)
Sodium: 138 mmol/L (ref 135–145)

## 2024-03-06 LAB — CBC
HCT: 31.7 % — ABNORMAL LOW (ref 36.0–46.0)
Hemoglobin: 11 g/dL — ABNORMAL LOW (ref 12.0–15.0)
MCH: 24.1 pg — ABNORMAL LOW (ref 26.0–34.0)
MCHC: 34.7 g/dL (ref 30.0–36.0)
MCV: 69.4 fL — ABNORMAL LOW (ref 80.0–100.0)
Platelets: 183 K/uL (ref 150–400)
RBC: 4.57 MIL/uL (ref 3.87–5.11)
RDW: 15.1 % (ref 11.5–15.5)
WBC: 8.5 K/uL (ref 4.0–10.5)
nRBC: 0 % (ref 0.0–0.2)

## 2024-03-06 MED ORDER — OXYCODONE-ACETAMINOPHEN 5-325 MG PO TABS: 1.0000 | ORAL_TABLET | Freq: Four times a day (QID) | ORAL | 0 refills | Status: AC | PRN

## 2024-03-06 MED ORDER — ASPIRIN 81 MG PO TBEC: 81.0000 mg | DELAYED_RELEASE_TABLET | Freq: Every day | ORAL | Status: AC

## 2024-03-06 NOTE — Plan of Care (Signed)
Ready for d/c

## 2024-03-06 NOTE — Progress Notes (Addendum)
" °  Progress Note    03/06/2024 7:08 AM 1 Day Post-Op  Subjective:  says she feels good this morning.  Says she feels like she might need to urinate as foley came out earlier.  She has not walked.  Denies any abdominal pain  Tm 99 HR 70's-90's NSR 120's-140's systolic 93% RA  Vitals:   03/06/24 0300 03/06/24 0400  BP: 132/61 134/62  Pulse: 81 82  Resp: (!) 22 20  Temp:  99 F (37.2 C)  SpO2: 94% 93%    Physical Exam: General:  no distress Cardiac:  regular Lungs:  non labored Incisions:  bilateral groins soft without hematoma Extremities:  palpable DP pulses bilaterally Abdomen:  soft, NT  CBC    Component Value Date/Time   WBC 8.5 03/06/2024 0500   RBC 4.57 03/06/2024 0500   HGB 11.0 (L) 03/06/2024 0500   HCT 31.7 (L) 03/06/2024 0500   PLT 183 03/06/2024 0500   MCV 69.4 (L) 03/06/2024 0500   MCH 24.1 (L) 03/06/2024 0500   MCHC 34.7 03/06/2024 0500   RDW 15.1 03/06/2024 0500    BMET    Component Value Date/Time   NA 138 03/06/2024 0500   K 3.6 03/06/2024 0500   CL 106 03/06/2024 0500   CO2 23 03/06/2024 0500   GLUCOSE 138 (H) 03/06/2024 0500   BUN 8 03/06/2024 0500   CREATININE 0.65 03/06/2024 0500   CALCIUM  8.8 (L) 03/06/2024 0500   GFRNONAA >60 03/06/2024 0500    INR    Component Value Date/Time   INR 1.2 03/05/2024 1010     Intake/Output Summary (Last 24 hours) at 03/06/2024 0708 Last data filed at 03/06/2024 9392 Gross per 24 hour  Intake 3243.89 ml  Output 2700 ml  Net 543.89 ml      Assessment/Plan:  78 y.o. female is s/p:  EVAR  1 Day Post-Op   -pt doing well this morning with palpable DP pulses and bilateral groins look good. -renal function looks fine -hgb stable -needs to void and ambulate prior to discharge.   -DVT prophylaxis:  sq heparin  -anticipate discharge later this morning.  F/u in 4 weeks with CTA a/p and see Dr. Pearline.  Her appt has been arranged.     Lucie Apt, PA-C [facility] [phone] 03/06/2024 7:08 AM   I agree with above. Ok for discharge today  Norman GORMAN Pearline MD [facility] Phone Number: [phone] 03/06/2024 8:27 AM   "

## 2024-03-06 NOTE — Anesthesia Postprocedure Evaluation (Signed)
"   Anesthesia Post Note  Patient: Erin Benson  Procedure(s) Performed: INSERTION, ENDOVASCULAR STENT GRAFT, AORTA, ABDOMINAL ULTRASOUND GUIDANCE, FOR VASCULAR ACCESS (Bilateral: Groin)     Patient location during evaluation: PACU Anesthesia Type: General Level of consciousness: sedated and patient cooperative Pain management: pain level controlled Vital Signs Assessment: post-procedure vital signs reviewed and stable Respiratory status: spontaneous breathing Cardiovascular status: stable Anesthetic complications: no   No notable events documented.  Last Vitals:  Vitals:   03/06/24 0730 03/06/24 0800  BP: (!) 115/50 139/61  Pulse: 83   Resp: (!) 23 (!) 23  Temp:    SpO2: 94%     Last Pain:  Vitals:   03/06/24 0800  TempSrc:   PainSc: 0-No pain                 [name]      "

## 2024-03-06 NOTE — Discharge Summary (Signed)
 " EVAR Discharge Summary   Erin Benson 1946-01-27 79 y.o. female  MRN: [id number]  Admission Date: 03/05/2024  Discharge Date: 03/06/2024  Physician: No att. providers found  Admission Diagnosis: Infrarenal abdominal aortic aneurysm (AAA) without rupture [I71.43] S/P AAA repair [S01.109, Z86.79]   HPI:   This is a 79 y.o. female  with a 5.3cm AAA.  We reviewed risks and benefits of intervention including endovascular repair versus open repair as well as continued surveillance with best medical therapy.  Given his anatomy and medical comorbidities I offered EVAR.  We reviewed the specific risks of bleeding, renal obstruction leading to renal failure, bowel ischemia, access site complication necessitating a groin incision, rupture and death.  The patient expressed understanding and was willing to proceed.   Hospital Course:  The patient was admitted to the hospital and taken to the operating room on 03/05/2024 and underwent: Ultrasound-guided access of bilateral common femoral arteries with Pro-glide preclose technique +[id number] Endovascular aortic repair with Aortobi-iliac stent graft [id number] Aortic extension placement [id number]    Findings: There was a conical mildly diseased aortic neck but with approximately 1.5 cm of seal.  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  Therefore an aortic cuff was placed from the opposite side in order to get a better angle and have the cuff seat on the right wall.  This landed in excellent position.  On completion there was wide patency of the renal arteries and iliac arteries and no evidence of endoleak   The pt tolerated the procedure well and was transported to the PACU in good condition.   By POD 1, pt was doing well with palpable pedal pulses and bilateral groins soft without hematoma.  She was able to void, walk and take in solids without difficulty.  She  was discharged home.     CBC    Component Value Date/Time   WBC 8.5 03/06/2024 0500   RBC 4.57 03/06/2024 0500   HGB 11.0 (L) 03/06/2024 0500   HCT 31.7 (L) 03/06/2024 0500   PLT 183 03/06/2024 0500   MCV 69.4 (L) 03/06/2024 0500   MCH 24.1 (L) 03/06/2024 0500   MCHC 34.7 03/06/2024 0500   RDW 15.1 03/06/2024 0500    BMET    Component Value Date/Time   NA 138 03/06/2024 0500   K 3.6 03/06/2024 0500   CL 106 03/06/2024 0500   CO2 23 03/06/2024 0500   GLUCOSE 138 (H) 03/06/2024 0500   BUN 8 03/06/2024 0500   CREATININE 0.65 03/06/2024 0500   CALCIUM  8.8 (L) 03/06/2024 0500   GFRNONAA >60 03/06/2024 0500       Discharge Instructions     Discharge patient   Complete by: As directed    Discharge home after she has voided, walked and been seen by Dr. Pearline.  Thanks   Discharge disposition: 01-Home or Self Care   Discharge patient date: 03/06/2024       Discharge Diagnosis:  Infrarenal abdominal aortic aneurysm (AAA) without rupture [I71.43] S/P AAA repair [S01.109, Z86.79]  Secondary Diagnosis: Patient Active Problem List   Diagnosis Date Noted   S/P AAA repair 03/05/2024   HTN (hypertension) 10/14/2023   Hyperlipemia 10/14/2023   Past Medical History:  Diagnosis Date   AAA (abdominal aortic aneurysm)    Hyperlipidemia    Hypertension      Allergies as of 03/06/2024  Reactions   Sulfa Antibiotics Nausea And Vomiting        Medication List     TAKE these medications    aspirin  EC 81 MG tablet Take 1 tablet (81 mg total) by mouth daily at 6 (six) AM. Swallow whole. Start taking on: March 07, 2024   atorvastatin  20 MG tablet Commonly known as: LIPITOR Take 20 mg by mouth daily.   calcium  carbonate 1500 (600 Ca) MG Tabs tablet Commonly known as: OSCAL Take 600 mg of elemental calcium  by mouth daily.   Cartia  XT 180 MG 24 hr capsule Generic drug: diltiazem  Take 180 mg by mouth daily.   hydrochlorothiazide  12.5 MG  capsule Commonly known as: MICROZIDE  Take 12.5 mg by mouth daily.   oxyCODONE -acetaminophen  5-325 MG tablet Commonly known as: Percocet Take 1 tablet by mouth every 6 (six) hours as needed for severe pain (pain score 7-10).        Discharge Instructions:  [facility]  Discharge Instructions Endovascular Aortic Aneurysm Repair  Please refer to the following instructions for your post-procedure care. Your surgeon or Physician Assistant will discuss any changes with you.  Activity  You are encouraged to walk as much as you can. You can slowly return to normal activities but must avoid strenuous activity and heavy lifting until your doctor tells you it's OK. Avoid activities such as vacuuming or swinging a gold club. It is normal to feel tired for several weeks after your surgery. Do not drive until your doctor gives the OK and you are no longer taking prescription pain medications. It is also normal to have difficulty with sleep habits, eating, and bowel movements after surgery. These will go away with time.  Bathing/Showering  You may shower after you go home. If you have an incision, do not soak in a bathtub, hot tub, or swim until the incision heals completely.  Incision Care  Shower every day. Clean your incision with mild soap and water. Pat the area dry with a clean towel. You do not need a bandage unless otherwise instructed. Do not apply any ointments or creams to your incision. If you clothing is irritating, you may cover your incision with a dry gauze pad.  Diet  Resume your normal diet. There are no special food restrictions following this procedure. A low fat/low cholesterol diet is recommended for all patients with vascular disease. In order to heal from your surgery, it is CRITICAL to get adequate nutrition. Your body requires vitamins, minerals, and protein. Vegetables are the best source of vitamins and minerals. Vegetables also provide  the perfect balance of protein. Processed food has little nutritional value, so try to avoid this.  Medications  Resume taking all of your medications unless your doctor or Physician Assistnat tells you not to. If your incision is causing pain, you may take over-the-counter pain relievers such as acetaminophen  (Tylenol ). If you were prescribed a stronger pain medication, please be aware these medications can cause nausea and constipation. Prevent nausea by taking the medication with a snack or meal. Avoid constipation by drinking plenty of fluids and eating foods with a high amount of fiber, such as fruits, vegetables, and grains.  Do not take Tylenol  if you are taking prescription pain medications.   Follow up  Our office will schedule a follow-up appointment with a C.T. scan 3-4 weeks after your surgery.  Please call us  immediately for any of the following conditions  Severe or worsening pain in your legs  or feet or in your abdomen back or chest. Increased pain, redness, drainage (pus) from your incision sit. Increased abdominal pain, bloating, nausea, vomiting or persistent diarrhea. Fever of 101 degrees or higher. Swelling in your leg (s),  Reduce your risk of vascular disease  Stop smoking. If you would like help call [facility] at 1-800-QUIT-NOW (667 366 0342) or [place] at [phone]. Manage your cholesterol Maintain a desired weight Control your diabetes Keep your blood pressure down  If you have questions, please call the office at [phone].   Prescriptions given: 1.  Roxicet #8 No Refill  Disposition: home  Patient's condition: is Good  Follow up: 1. Dr. Pearline in 4 weeks with CTA protocol   Lucie Apt, PA-C [facility] [phone] 03/06/2024  9:42 AM   - For VQI Registry use - Post-op:  Time to Extubation: [x]  In OR, [ ]  < 12 hrs, [ ]  12-24 hrs, [ ]  >=24 hrs Vasopressors Req. Post-op: No MI: No., [ ]  Troponin only, [ ]   EKG or Clinical New Arrhythmia: No CHF: No ICU Stay: 1 day in progressive Transfusion: No     If yes, n/a units given  Complications: Resp failure: No., [ ]  Pneumonia, [ ]  Ventilator Chg in renal function: No., [ ]  Inc. Cr > 0.5, [ ]  Temp. Dialysis,  [ ]  Permanent dialysis Leg ischemia: No., no Surgery needed, [ ]  Yes, Surgery needed,  [ ]  Amputation Bowel ischemia: No., [ ]  Medical Rx, [ ]  Surgical Rx Wound complication: No., [ ]  Superficial separation/infection, [ ]  Return to OR Return to OR: No  Return to OR for bleeding: No Stroke: No., [ ]  Minor, [ ]  Major  Discharge medications: Statin use:  Yes  ASA use:  Yes  Plavix use:  No  Beta blocker use:  No  ARB use:  No ACEI use:  No CCB use:  No   "

## 2024-03-09 ENCOUNTER — Telehealth: Payer: Self-pay

## 2024-03-09 NOTE — Telephone Encounter (Signed)
 Spoke to patient who reported to triage that she had experienced a brief nose bleed this morning.  Due to nose bleed, she didn't take her ASA.  She wanted to know if she needed to take her ASA.  This nurse confirmed to patient that it is very important that she takes her medication, regardless of nose bleeds.  Patient acknowledged instruction but also reported constipation.  This nurse advised water and movement but if needed, Colace would assist with relief.  Patient acknowledged instructions.

## 2024-03-12 ENCOUNTER — Other Ambulatory Visit: Payer: Self-pay

## 2024-03-12 DIAGNOSIS — F17200 Nicotine dependence, unspecified, uncomplicated: Secondary | ICD-10-CM | POA: Insufficient documentation

## 2024-03-12 DIAGNOSIS — I714 Abdominal aortic aneurysm, without rupture, unspecified: Secondary | ICD-10-CM

## 2024-03-12 DIAGNOSIS — D569 Thalassemia, unspecified: Secondary | ICD-10-CM | POA: Insufficient documentation

## 2024-03-12 DIAGNOSIS — E785 Hyperlipidemia, unspecified: Secondary | ICD-10-CM | POA: Insufficient documentation

## 2024-03-27 ENCOUNTER — Ambulatory Visit (HOSPITAL_COMMUNITY): Admission: RE | Admit: 2024-03-27 | Source: Ambulatory Visit | Attending: Vascular Surgery | Admitting: Vascular Surgery

## 2024-03-27 DIAGNOSIS — I714 Abdominal aortic aneurysm, without rupture, unspecified: Secondary | ICD-10-CM

## 2024-03-27 MED ORDER — IOHEXOL 350 MG/ML SOLN
80.0000 mL | Freq: Once | INTRAVENOUS | Status: AC | PRN
  Administered 2024-03-27: 80 mL via INTRAVENOUS

## 2024-04-10 ENCOUNTER — Encounter: Admitting: Vascular Surgery
